# Patient Record
Sex: Female | Born: 1985 | Race: White | Hispanic: No | Marital: Married | Smoking: Never smoker
Health system: Southern US, Community
[De-identification: ages and names within clinical notes are randomized; demographics above are authoritative.]

## PROBLEM LIST (undated history)

## (undated) ENCOUNTER — Inpatient Hospital Stay (HOSPITAL_COMMUNITY): Payer: Self-pay

## (undated) DIAGNOSIS — D649 Anemia, unspecified: Secondary | ICD-10-CM

## (undated) DIAGNOSIS — O24419 Gestational diabetes mellitus in pregnancy, unspecified control: Secondary | ICD-10-CM

## (undated) DIAGNOSIS — G971 Other reaction to spinal and lumbar puncture: Secondary | ICD-10-CM

## (undated) DIAGNOSIS — R112 Nausea with vomiting, unspecified: Secondary | ICD-10-CM

## (undated) DIAGNOSIS — Z9889 Other specified postprocedural states: Secondary | ICD-10-CM

## (undated) HISTORY — PX: OTHER SURGICAL HISTORY: SHX169

## (undated) HISTORY — PX: CERVICAL CONE BIOPSY: SUR198

---

## 2011-11-15 ENCOUNTER — Encounter (HOSPITAL_COMMUNITY): Payer: Self-pay | Admitting: Emergency Medicine

## 2011-11-15 ENCOUNTER — Emergency Department (HOSPITAL_COMMUNITY)
Admission: EM | Admit: 2011-11-15 | Discharge: 2011-11-16 | Disposition: A | Discharge status: Home / Self Care | Payer: Medicaid Other | Attending: Emergency Medicine | Admitting: Emergency Medicine

## 2011-11-15 DIAGNOSIS — R51 Headache: Secondary | ICD-10-CM | POA: Insufficient documentation

## 2011-11-15 DIAGNOSIS — H53149 Visual discomfort, unspecified: Secondary | ICD-10-CM | POA: Insufficient documentation

## 2011-11-15 LAB — CBC
Hemoglobin: 13.6 g/dL (ref 12.0–15.0)
MCH: 29.8 pg (ref 26.0–34.0)
MCV: 85.3 fL (ref 78.0–100.0)
RBC: 4.57 MIL/uL (ref 3.87–5.11)
WBC: 10.5 10*3/uL (ref 4.0–10.5)

## 2011-11-15 LAB — DIFFERENTIAL
Eosinophils Absolute: 0.2 10*3/uL (ref 0.0–0.7)
Eosinophils Relative: 2 % (ref 0–5)
Lymphocytes Relative: 28 % (ref 12–46)
Lymphs Abs: 2.9 10*3/uL (ref 0.7–4.0)
Monocytes Relative: 6 % (ref 3–12)
Neutrophils Relative %: 65 % (ref 43–77)

## 2011-11-15 LAB — BASIC METABOLIC PANEL
CO2: 19 mEq/L (ref 19–32)
Glucose, Bld: 131 mg/dL — ABNORMAL HIGH (ref 70–99)
Potassium: 4 mEq/L (ref 3.5–5.1)
Sodium: 135 mEq/L (ref 135–145)

## 2011-11-15 MED ORDER — SODIUM CHLORIDE 0.9 % IV BOLUS (SEPSIS)
1000.0000 mL | Freq: Once | INTRAVENOUS | Status: AC
  Administered 2011-11-16: 1000 mL via INTRAVENOUS

## 2011-11-15 MED ORDER — DEXAMETHASONE SODIUM PHOSPHATE 10 MG/ML IJ SOLN
10.0000 mg | Freq: Once | INTRAMUSCULAR | Status: AC
  Administered 2011-11-16: 10 mg via INTRAVENOUS
  Filled 2011-11-15: qty 1

## 2011-11-15 MED ORDER — DIPHENHYDRAMINE HCL 50 MG/ML IJ SOLN
25.0000 mg | Freq: Once | INTRAMUSCULAR | Status: AC
  Administered 2011-11-16: 25 mg via INTRAVENOUS
  Filled 2011-11-15: qty 1

## 2011-11-15 MED ORDER — METOCLOPRAMIDE HCL 5 MG/ML IJ SOLN
10.0000 mg | Freq: Once | INTRAMUSCULAR | Status: AC
  Administered 2011-11-16: 10 mg via INTRAVENOUS
  Filled 2011-11-15: qty 2

## 2011-11-15 NOTE — ED Provider Notes (Signed)
History     CSN: [id number]  Arrival date & time 11/15/11  2155   First MD Initiated Contact with Patient 11/15/11 2312      Chief Complaint  Patient presents with  . 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  She reports continued headache, photophobia but denies fever, chills, nausea or vomiting - states that pain is worse with leaning forward and bending over to tie her shoes.  Patient is a 26 y.o. female presenting with headaches. The history is provided by the patient. No language interpreter was used.  Headache  This is a recurrent problem. The current episode started more than 1 week ago. The problem occurs constantly. The problem has not changed since onset.The headache is associated with bright light and coughing. The pain is located in the frontal region. The quality of the pain is described as throbbing. The pain is at a severity of 10/10. The pain is severe. The pain does not radiate. Pertinent negatives include no anorexia, no fever, no malaise/fatigue, no chest pressure, no near-syncope, no orthopnea, no palpitations, no syncope, no shortness of breath, no nausea and no vomiting. She has tried oral narcotic analgesics for the symptoms. The  treatment provided no relief.    History reviewed. No pertinent past medical history.  Past Surgical History  Procedure Date  . Cesarean section   . Cervical cone biopsy     No family history on file.  History  Substance Use Topics  . Smoking status: Never Smoker   . Smokeless tobacco: Not on file  . Alcohol Use: No    OB History    Grav Para Term Preterm Abortions TAB SAB Ect Mult Living                  Review of Systems  Constitutional: Negative for fever and malaise/fatigue.  HENT: Negative for neck pain, neck stiffness and sinus pressure.   Eyes: Positive for photophobia. Negative for pain and visual disturbance.  Respiratory: Negative for shortness of breath.   Cardiovascular: Negative for palpitations, orthopnea, syncope and near-syncope.  Gastrointestinal: Negative for nausea, vomiting, abdominal pain and anorexia.  Neurological: Positive for dizziness and headaches. Negative for seizures, syncope, speech difficulty, weakness and numbness.  All other systems reviewed and are negative.    Allergies  Tramadol  Home Medications  No current outpatient prescriptions on file.  BP 135/87  Temp(Src) 98.4 F (36.9 C) (Oral)  Resp 18  SpO2 99%  LMP 10/31/2011  Physical Exam  Nursing note and vitals reviewed. Constitutional: She is oriented to person, place, and time. She appears well-developed and well-nourished. No distress.  HENT:  Head: Normocephalic and atraumatic.  Right Ear: External ear normal.  Left Ear: External ear normal.  Nose: Nose normal.  Mouth/Throat: Oropharynx is clear and moist. No oropharyngeal exudate.  Eyes: Conjunctivae are normal. Pupils are equal, round, and reactive to light. No scleral icterus.  Neck: Normal range of motion. Neck supple.  Cardiovascular: Normal rate, regular rhythm and normal heart sounds.  Exam reveals no gallop and no friction rub.   No murmur heard. Pulmonary/Chest: Effort normal and breath sounds normal. No  respiratory distress. She exhibits no tenderness.  Abdominal: Soft. Bowel sounds are normal. She exhibits no distension and no mass. There is no tenderness. There is no rebound and no guarding.  Musculoskeletal: Normal range of motion. She exhibits no edema and no tenderness.  Lymphadenopathy:    She has no cervical adenopathy.  Neurological: She is alert and oriented to person, place, and time. No cranial nerve deficit. She exhibits normal muscle tone. Coordination normal.  Skin: Skin is warm and dry. No rash noted. No erythema. No pallor.  Psychiatric: She has a normal mood and affect. Her behavior is normal. Judgment and thought content normal.    ED Course  Procedures (including critical care time)  Labs Reviewed  BASIC METABOLIC PANEL - Abnormal; Notable for the following:    Glucose, Bld 131 (*)    All other components within normal limits  CBC  DIFFERENTIAL  CBC  DIFFERENTIAL   No results found. Results for orders placed during the hospital encounter of 11/15/11  BASIC METABOLIC PANEL      Component Value Range   Sodium 135  135 - 145 (mEq/L)   Potassium 4.0  3.5 - 5.1 (mEq/L)   Chloride 102  96 - 112 (mEq/L)   CO2 19  19 - 32 (mEq/L)   Glucose, Bld 131 (*) 70 - 99 (mg/dL)   BUN 9  6 - 23 (mg/dL)   Creatinine, Ser 4.09  0.50 - 1.10 (mg/dL)   Calcium 9.6  8.4 - 81.1 (mg/dL)   GFR calc non Af Amer >90  >90 (mL/min)   GFR calc Af Amer >90  >90 (mL/min)  CBC      Component Value Range   WBC 10.5  4.0 - 10.5 (K/uL)   RBC 4.57  3.87 - 5.11 (MIL/uL)   Hemoglobin 13.6  12.0 - 15.0 (g/dL)   HCT 91.4  78.2 - 95.6 (%)   MCV 85.3  78.0 - 100.0 (fL)   MCH 29.8  26.0 - 34.0 (pg)   MCHC 34.9  30.0 - 36.0 (g/dL)   RDW 21.3  08.6 - 57.8 (%)   Platelets 245  150 - 400 (K/uL)  DIFFERENTIAL      Component Value Range   Neutrophils Relative 65  43 - 77 (%)   Neutro Abs 6.8  1.7 - 7.7 (K/uL)   Lymphocytes Relative 28  12 - 46 (%)   Lymphs Abs 2.9  0.7 - 4.0 (K/uL)   Monocytes  Relative 6  3 - 12 (%)   Monocytes Absolute 0.6  0.1 - 1.0 (K/uL)   Eosinophils Relative 2  0 - 5 (%)   Eosinophils Absolute 0.2  0.0 - 0.7 (K/uL)   Basophils Relative 0  0 - 1 (%)   Basophils Absolute 0.0  0.0 - 0.1 (K/uL)  POCT PREGNANCY, URINE      Component Value Range   Preg Test, Ur NEGATIVE  NEGATIVE    No results found.    No diagnosis found.    MDM  Have obtained lab results from [facility] which notes that later the  CSF culture grew out 1 colony of gram positive cocci - they were thinking this was likely a contamination but they called her with this - all of the other tests were normal.  Plan to get pain under control and though I doubt the need, may admit the patient for repeat LP        Scarlette Calico C. [place], [place] 11/16/11 0126

## 2011-11-15 NOTE — ED Notes (Signed)
PT. REPORTS HEADACHE SINCE Monday LUMBAR TAP DONE AT [facility], RECEIVED A CALL FROM HOSPITAL TO GO TO ER FOR FURTHER EVALUATION DUE TO FINDINGS AT LP RESULTS.

## 2011-11-16 MED ORDER — SULFAMETHOXAZOLE-TRIMETHOPRIM 800-160 MG PO TABS: 1.0000 | ORAL_TABLET | Freq: Two times a day (BID) | ORAL | Status: AC

## 2011-11-16 MED ORDER — OXYCODONE-ACETAMINOPHEN 5-325 MG PO TABS: 2.0000 | ORAL_TABLET | ORAL | Status: AC | PRN

## 2011-11-16 MED ORDER — CEPHALEXIN 500 MG PO CAPS: 500.0000 mg | ORAL_CAPSULE | Freq: Four times a day (QID) | ORAL | Status: AC

## 2011-11-16 MED ORDER — DROPERIDOL 2.5 MG/ML IJ SOLN
1.2500 mg | Freq: Once | INTRAMUSCULAR | Status: AC
  Administered 2011-11-16: 1.25 mg via INTRAVENOUS
  Filled 2011-11-16: qty 0.5

## 2011-11-16 MED ORDER — VANCOMYCIN HCL IN DEXTROSE 1-5 GM/200ML-% IV SOLN
1000.0000 mg | Freq: Once | INTRAVENOUS | Status: AC
  Administered 2011-11-16: 1000 mg via INTRAVENOUS
  Filled 2011-11-16: qty 200

## 2011-11-16 MED ORDER — HYDROMORPHONE HCL PF 1 MG/ML IJ SOLN
1.0000 mg | Freq: Once | INTRAMUSCULAR | Status: AC
  Administered 2011-11-16: 1 mg via INTRAVENOUS
  Filled 2011-11-16: qty 1

## 2011-11-16 MED ORDER — OXYCODONE-ACETAMINOPHEN 5-325 MG PO TABS
2.0000 | ORAL_TABLET | Freq: Once | ORAL | Status: AC
  Administered 2011-11-16: 2 via ORAL
  Filled 2011-11-16: qty 2

## 2011-11-16 NOTE — ED Provider Notes (Signed)
Medical screening examination/treatment/procedure(s) were conducted as a shared visit with non-physician practitioner(s) and myself.  I personally evaluated the patient during the encounter  See my previous note  Glynn Octave, MD 11/16/11 [phone]

## 2011-11-16 NOTE — Discharge Instructions (Signed)
Your lumbar puncture results are likely contaminated. It is very unlikely that you have meningitis.  Take the antibiotics as a precaution.  Follow up with your doctor this week. Return to the ED if you develop new or worsening symptoms.  Headache, General, Unknown Cause The specific cause of your headache may not have been found today. There are many causes and types of headache. A few common ones are:  Tension headache.   Migraine.   Infections (examples: dental and sinus infections).   Bone and/or joint problems in the neck or jaw.   Depression.   Eye problems.  These headaches are not life threatening.  Headaches can sometimes be diagnosed by a patient history and a physical exam. Sometimes, lab and imaging studies (such as x-ray and/or CT scan) are used to rule out more serious problems. In some cases, a spinal tap (lumbar puncture) may be requested. There are many times when your exam and tests may be normal on the first visit even when there is a serious problem causing your headaches. Because of that, it is very important to follow up with your doctor or local clinic for further evaluation. FINDING OUT THE RESULTS OF TESTS  If a radiology test was performed, a radiologist will review your results.   You will be contacted by the emergency department or your physician if any test results require a change in your treatment plan.   Not all test results may be available during your visit. If your test results are not back during the visit, make an appointment with your caregiver to find out the results. Do not assume everything is normal if you have not heard from your caregiver or the medical facility. It is important for you to follow up on all of your test results.  HOME CARE INSTRUCTIONS   Keep follow-up appointments with your caregiver, or any specialist referral.   Only take over-the-counter or prescription medicines for pain, discomfort, or fever as directed by your caregiver.     Biofeedback, massage, or other relaxation techniques may be helpful.   Ice packs or heat applied to the head and neck can be used. Do this three to four times per day, or as needed.   Call your doctor if you have any questions or concerns.   If you smoke, you should quit.  SEEK MEDICAL CARE IF:   You develop problems with medications prescribed.   You do not respond to or obtain relief from medications.   You have a change from the usual headache.   You develop nausea or vomiting.  SEEK IMMEDIATE MEDICAL CARE IF:   If your headache becomes severe.   You have an unexplained oral temperature above 102 F (38.9 C), or as your caregiver suggests.   You have a stiff neck.   You have loss of vision.   You have muscular weakness.   You have loss of muscular control.   You develop severe symptoms different from your first symptoms.   You start losing your balance or have trouble walking.   You feel faint or pass out.  MAKE SURE YOU:   Understand these instructions.   Will watch your condition.   Will get help right away if you are not doing well or get worse.  Document Released: 08/12/2005 Document Revised: 08/01/2011 Document Reviewed: 03/31/2008 [facility] Patient Information 2012 [place], [place].

## 2011-11-16 NOTE — ED Provider Notes (Signed)
5 days of headache sent from outside hospital today with LP culture that had a rare gram-positive cocci.  Results obtained from [facility]. Patient had CSF with 1 white cell and 0 RBCs. Her Gram stain was negative. Patient was called today because culture showed rare growth of gram-positive cocci. States her headache is been the same for the past 5 days. She has a nonfocal neurological exam, no meningismus, no fever. She appears well.  Gram positive cocci likely contaminant.  Patient clinically does not have meningitis if she did have bacterial meningitis, she would appear much more ill.  We'll empirically cover her with a dose of vancomycin and treat with by mouth antibiotics.  Glynn Octave, MD 11/16/11 0300

## 2013-08-26 NOTE — L&D Delivery Note (Signed)
Cesarean Section Procedure Note  Indications: previous uterine incision kerr x2 and patient in early labor   Pre-operative Diagnosis: 38 week 2 day pregnancy, prior LTCS in early labor  Post-operative Diagnosis: same  Surgeon: Caffie Damme  Assistants: None  Anesthesia: Spinal anesthesia  ASA Class: 2  Procedure Details  The patient was seen in the Holding Room. The risks, benefits, complications, treatment options, and expected outcomes were discussed with the patient. The patient concurred with the proposed plan, giving informed consent. The site of surgery properly noted/marked. The patient was taken to Operating Room # 9, identified as Vicki Ortiz and the procedure verified as C-Section Delivery. A Time Out was held and the above information confirmed.  After induction of spinal anesthesia, the patient was placed in the dorsal supine position with a leftward tilt, draped and prepped in the usual sterile manner. A Pfannenstiel incision was made and carried down through the subcutaneous tissue to the fascia. The fascia was incised in the midline and the fascial incision was extended laterally with Mayo scissors. The superior aspect of the fascial incision was grasped with Coker clamps x2, tented up and the rectus muscles dissected off sharply with the scalpel. The rectus was then dissected off with blunt dissection and Mayo scissors inferiorly. The rectus muscles were separated in the midline. The abdominal peritoneum was identified, tented up, entered sharply, and the incision was extended superiorly and inferiorly with good visualization of the bladder. The Alexis retractor was then deployed. The vesicouterine peritoneum was identified, tented up, entered sharply with Metzenbaum scissors, and the bladder flap was created digitally. Scalpel was then used to make a low transverse incision on the uterus which was extended laterally with blunt dissection. The fetal vertex was identified,  difficulty delivering head, therefore Vacuum applied to fetal vertex and then the head was delivered easily through the uterine incision followed by the body. The A live female infant was bulb suctioned on the operative field cried vigorously, cord was clamped and cut and the infant was passed to the waiting neonatologist. Apgars 8/9. Placenta was then delivered spontaneously, intact and appear normal, the uterus was cleared of all clot and debris. The uterine incision was repaired with #0 Monocryl in running locked fashion. A second imbricating suture of 0 Monocryl was utilized to ensure hemostasis. Ovaries and tubes were inspected and normal. The peritoneal cavity was irrigated with warm normal saline. The Alexis retractor was removed. The abdominal peritoneum was reapproximated with 2-0 Chromic in a running fashion, the rectus muscles was reapproximated one interrupted suture of 2-0 chromic. in interrupted fashion. The fascia was closed with 0 looped PDS in a running fashion from both ends of fascia, Met in the middle. The subcutaneous layer was reapproximated with 2-0 plain gut. The skin was closed with 4-0 vicryl in a subcuticular fashion and dressed with benzoine, steri strips and pressure dressing. All sponge lap and needle counts were correct x2. Patient tolerated the procedure well and recovered in stable condition following the procedure.  Instrument, sponge, and needle counts were correct prior the abdominal closure and at the conclusion of the case.  Findings:  Live female infant,8lbs 4.6oz  Apgars 8/9, clear fluid, normal appearing placenta, normal uterus, bilateral tubes and ovaries  Estimated Blood Loss: 893m  Drains: Foley catheter  Specimens: Placenta  Implants: none  Complications: None; patient tolerated the procedure well.  Disposition: PACU - hemodynamically stable.  WRogelio SeenPinn

## 2013-10-10 ENCOUNTER — Encounter (HOSPITAL_COMMUNITY): Payer: Self-pay | Admitting: Emergency Medicine

## 2013-10-10 ENCOUNTER — Emergency Department (HOSPITAL_COMMUNITY)
Admission: EM | Admit: 2013-10-10 | Discharge: 2013-10-10 | Disposition: A | Discharge status: Home / Self Care | Payer: Medicaid Other | Attending: Emergency Medicine | Admitting: Emergency Medicine

## 2013-10-10 DIAGNOSIS — O9989 Other specified diseases and conditions complicating pregnancy, childbirth and the puerperium: Secondary | ICD-10-CM | POA: Insufficient documentation

## 2013-10-10 DIAGNOSIS — L03319 Cellulitis of trunk, unspecified: Secondary | ICD-10-CM

## 2013-10-10 DIAGNOSIS — Z3490 Encounter for supervision of normal pregnancy, unspecified, unspecified trimester: Secondary | ICD-10-CM

## 2013-10-10 DIAGNOSIS — L02211 Cutaneous abscess of abdominal wall: Secondary | ICD-10-CM

## 2013-10-10 DIAGNOSIS — L02219 Cutaneous abscess of trunk, unspecified: Secondary | ICD-10-CM | POA: Insufficient documentation

## 2013-10-10 MED ORDER — ACETAMINOPHEN 325 MG PO TABS
650.0000 mg | ORAL_TABLET | Freq: Once | ORAL | Status: AC
  Administered 2013-10-10: 650 mg via ORAL
  Filled 2013-10-10: qty 2

## 2013-10-10 MED ORDER — CLINDAMYCIN HCL 150 MG PO CAPS: 300.0000 mg | ORAL_CAPSULE | Freq: Three times a day (TID) | ORAL | Status: DC

## 2013-10-10 MED ORDER — CLINDAMYCIN HCL 300 MG PO CAPS
300.0000 mg | ORAL_CAPSULE | Freq: Once | ORAL | Status: AC
  Administered 2013-10-10: 300 mg via ORAL
  Filled 2013-10-10: qty 1

## 2013-10-10 NOTE — ED Notes (Signed)
The pt is 25 weeks preg and she has a blistered area to her umbilicus.  The area below that is red swollen and painful.  Her edc is may 30.  Normal preg.

## 2013-10-10 NOTE — ED Notes (Signed)
Pt reports redness and tender area to lower umbilicus for three days. Small hardened area noted, skin red and warm to touch. Pt observed touching area, informed not to touch area.

## 2013-10-10 NOTE — ED Notes (Signed)
I/D tray placed at bedside.  

## 2013-10-10 NOTE — ED Provider Notes (Signed)
CSN: [id number]     Arrival date & time 10/10/13  0055 History   First MD Initiated Contact with Patient 10/10/13 0110     Chief Complaint  Patient presents with  . Cellulitis     (Consider location/radiation/quality/duration/timing/severity/associated sxs/prior Treatment) The history is provided by the patient.   28 year old female who is [redacted] weeks pregnant has noted redness and pain in the area inferior to her umbilicus has been present for the last 3 days and getting worse. Pain is moderate and she rates it at 6/10. It is worse with palpation. Nothing makes it better. She denies fever or chills. The area is slightly swollen and overlies scar from previous laparoscopy.  History reviewed. No pertinent past medical history. Past Surgical History  Procedure Laterality Date  . Cesarean section    . Cervical cone biopsy     No family history on file. History  Substance Use Topics  . Smoking status: Never Smoker   . Smokeless tobacco: Not on file  . Alcohol Use: No   OB History   Grav Para Term Preterm Abortions TAB SAB Ect Mult Living   1              Review of Systems  All other systems reviewed and are negative.      Allergies  Tramadol  Home Medications  No current outpatient prescriptions on file. BP 131/74  Pulse 84  Temp(Src) 97.4 F (36.3 C) (Oral)  Resp 16  Ht 6' (1.829 m)  Wt 260 lb (117.935 kg)  BMI 35.25 kg/m2  SpO2 98% Physical Exam  Nursing note and vitals reviewed.  28 year old female, resting comfortably and in no acute distress. Vital signs are normal. Oxygen saturation is 98%, which is normal. Head is normocephalic and atraumatic. PERRLA, EOMI. Oropharynx is clear. Neck is nontender and supple without adenopathy or JVD. Back is nontender and there is no CVA tenderness. Lungs are clear without rales, wheezes, or rhonchi. Chest is nontender. Heart has regular rate and rhythm without murmur. Abdomen has a gravid uterus with size consistent with  dates. Periumbilical scar from previous laparoscopy is present with mild keloid formation. Inferior to this, has an area of erythema with a small area of fluctuance. Appearance is suspicious for abscess with cellulitis. Extremities have no cyanosis or edema, full range of motion is present. Skin is warm and dry without rash. Neurologic: Mental status is normal, cranial nerves are intact, there are no motor or sensory deficits.  ED Course  Procedures (including critical care time) Limited bedside ultrasound was done to evaluate possible abscess. Areas of fluid collection in the subcutaneous tissue are noted consistent with abscess. Images were saved.  INCISION AND DRAINAGE Performed by: YQMVH,QIONGGLICK,Sehaj Kolden Consent: Verbal consent obtained. Risks and benefits: risks, benefits and alternatives were discussed Type: abscess  Body area: Abdominal wall  Anesthesia: local infiltration  Incision was made with a scalpel.  Local anesthetic: lidocaine 2% without epinephrine  Anesthetic total: 3 ml  Complexity: complex Blunt dissection to break up loculations  Drainage: purulent  Drainage amount: Moderate   Packing material: None   Patient tolerance: Patient tolerated the procedure well with no immediate complications.    MDM   Final diagnoses:  Abscess of abdominal wall  Pregnancy    Abscess versus cellulitis of the abdominal wall. Second trimester pregnancy. This after incision and drainage, she is discharged with prescription for clindamycin.    Dione Boozeavid Calle Schader, MD 10/10/13 [phone]

## 2013-10-10 NOTE — Discharge Instructions (Signed)
Abscess An abscess is an infected area that contains a collection of pus and debris.It can occur in almost any part of the body. An abscess is also known as a furuncle or boil. CAUSES  An abscess occurs when tissue gets infected. This can occur from blockage of oil or sweat glands, infection of hair follicles, or a minor injury to the skin. As the body tries to fight the infection, pus collects in the area and creates pressure under the skin. This pressure causes pain. People with weakened immune systems have difficulty fighting infections and get certain abscesses more often.  SYMPTOMS Usually an abscess develops on the skin and becomes a painful mass that is red, warm, and tender. If the abscess forms under the skin, you may feel a moveable soft area under the skin. Some abscesses break open (rupture) on their own, but most will continue to get worse without care. The infection can spread deeper into the body and eventually into the bloodstream, causing you to feel ill.  DIAGNOSIS  Your caregiver will take your medical history and perform a physical exam. A sample of fluid may also be taken from the abscess to determine what is causing your infection. TREATMENT  Your caregiver may prescribe antibiotic medicines to fight the infection. However, taking antibiotics alone usually does not cure an abscess. Your caregiver may need to make a small cut (incision) in the abscess to drain the pus. In some cases, gauze is packed into the abscess to reduce pain and to continue draining the area. HOME CARE INSTRUCTIONS   Only take over-the-counter or prescription medicines for pain, discomfort, or fever as directed by your caregiver.  If you were prescribed antibiotics, take them as directed. Finish them even if you start to feel better.  If gauze is used, follow your caregiver's directions for changing the gauze.  To avoid spreading the infection:  Keep your draining abscess covered with a  bandage.  Wash your hands well.  Do not share personal care items, towels, or whirlpools with others.  Avoid skin contact with others.  Keep your skin and clothes clean around the abscess.  Keep all follow-up appointments as directed by your caregiver. SEEK MEDICAL CARE IF:   You have increased pain, swelling, redness, fluid drainage, or bleeding.  You have muscle aches, chills, or a general ill feeling.  You have a fever. MAKE SURE YOU:   Understand these instructions.  Will watch your condition.  Will get help right away if you are not doing well or get worse. Document Released: 05/22/2005 Document Revised: 02/11/2012 Document Reviewed: 10/25/2011 [facility] Patient Information 2014 [place], [place].  Clindamycin capsules What is this medicine? CLINDAMYCIN (KLIN da MYE sin) is a lincosamide antibiotic. It is used to treat certain kinds of bacterial infections. It will not work for colds, flu, or other viral infections. This medicine may be used for other purposes; ask your health care provider or pharmacist if you have questions. COMMON BRAND NAME(S): Cleocin What should I tell my health care provider before I take this medicine? They need to know if you have any of these conditions: -kidney disease -liver disease -stomach problems like colitis -an unusual or allergic reaction to clindamycin, lincomycin, or other medicines, foods, dyes like tartrazine or preservatives -pregnant or trying to get pregnant -breast-feeding How should I use this medicine? Take this medicine by mouth with a full glass of water. Follow the directions on the prescription label. You can take this medicine with food or on  an empty stomach. If the medicine upsets your stomach, take it with food. Take your medicine at regular intervals. Do not take your medicine more often than directed. Take all of your medicine as directed even if you think your are better. Do not skip doses or stop your medicine  early. Talk to your pediatrician regarding the use of this medicine in children. Special care may be needed. Overdosage: If you think you have taken too much of this medicine contact a poison control center or emergency room at once. NOTE: This medicine is only for you. Do not share this medicine with others. What if I miss a dose? If you miss a dose, take it as soon as you can. If it is almost time for your next dose, take only that dose. Do not take double or extra doses. What may interact with this medicine? -chloramphenicol -erythromycin -kaolin products This list may not describe all possible interactions. Give your health care provider a list of all the medicines, herbs, non-prescription drugs, or dietary supplements you use. Also tell them if you smoke, drink alcohol, or use illegal drugs. Some items may interact with your medicine. What should I watch for while using this medicine? Tell your doctor or healthcare professional if your symptoms do not start to get better or if they get worse. Do not treat diarrhea with over the counter products. Contact your doctor if you have diarrhea that lasts more than 2 days or if it is severe and watery. What side effects may I notice from receiving this medicine? Side effects that you should report to your doctor or health care professional as soon as possible: -allergic reactions like skin rash, itching or hives, swelling of the face, lips, or tongue -dark urine -pain on swallowing -redness, blistering, peeling or loosening of the skin, including inside the mouth -unusual bleeding or bruising -unusually weak or tired -yellowing of eyes or skin Side effects that usually do not require medical attention (report to your doctor or health care professional if they continue or are bothersome): -diarrhea -itching in the rectal or genital area -joint pain -nausea, vomiting -stomach pain This list may not describe all possible side effects. Call your  doctor for medical advice about side effects. You may report side effects to FDA at 1-800-FDA-1088. Where should I keep my medicine? Keep out of the reach of children. Store at room temperature between 20 and 25 degrees C (68 and 77 degrees F). Throw away any unused medicine after the expiration date. NOTE: This sheet is a summary. It may not cover all possible information. If you have questions about this medicine, talk to your doctor, pharmacist, or health care provider.  2014, [employer]vier/Gold Standard. (2010-01-03 10:12:31)

## 2013-11-06 ENCOUNTER — Encounter (HOSPITAL_COMMUNITY): Payer: Self-pay

## 2013-11-06 ENCOUNTER — Inpatient Hospital Stay (HOSPITAL_COMMUNITY)
Admission: AD | Admit: 2013-11-06 | Discharge: 2013-11-06 | Disposition: A | Discharge status: Home / Self Care | Payer: Medicaid Other | Source: Ambulatory Visit | Attending: Obstetrics and Gynecology | Admitting: Obstetrics and Gynecology

## 2013-11-06 DIAGNOSIS — S3140XA Unspecified open wound of vagina and vulva, initial encounter: Secondary | ICD-10-CM | POA: Insufficient documentation

## 2013-11-06 DIAGNOSIS — O99891 Other specified diseases and conditions complicating pregnancy: Secondary | ICD-10-CM | POA: Insufficient documentation

## 2013-11-06 DIAGNOSIS — O4690 Antepartum hemorrhage, unspecified, unspecified trimester: Secondary | ICD-10-CM | POA: Insufficient documentation

## 2013-11-06 DIAGNOSIS — O9989 Other specified diseases and conditions complicating pregnancy, childbirth and the puerperium: Principal | ICD-10-CM

## 2013-11-06 DIAGNOSIS — Y92009 Unspecified place in unspecified non-institutional (private) residence as the place of occurrence of the external cause: Secondary | ICD-10-CM | POA: Insufficient documentation

## 2013-11-06 DIAGNOSIS — S31512A Laceration without foreign body of unspecified external genital organs, female, initial encounter: Secondary | ICD-10-CM

## 2013-11-06 DIAGNOSIS — X58XXXA Exposure to other specified factors, initial encounter: Secondary | ICD-10-CM | POA: Insufficient documentation

## 2013-11-06 HISTORY — DX: Other reaction to spinal and lumbar puncture: G97.1

## 2013-11-06 HISTORY — DX: Anemia, unspecified: D64.9

## 2013-11-06 HISTORY — DX: Nausea with vomiting, unspecified: R11.2

## 2013-11-06 HISTORY — DX: Gestational diabetes mellitus in pregnancy, unspecified control: O24.419

## 2013-11-06 HISTORY — DX: Other specified postprocedural states: Z98.890

## 2013-11-06 NOTE — Discharge Instructions (Signed)

## 2013-11-06 NOTE — MAU Note (Signed)
Vag bleeding at 0245 when she was having intercourse, Large amt on sheet and dark red blood covered her hand. Patient changed into gown and is no longer bleeding here.

## 2013-11-06 NOTE — MAU Provider Note (Signed)
Chief Complaint:  Vaginal Bleeding   First Provider Initiated Contact with Patient 11/06/13 0416      HPI: Vicki Ortiz is a 28 y.o. G4P3003 at [redacted]w[redacted]d who presents to maternity admissions reporting bright red vaginal bleeding occuring during intercourse.  She reports that she felt like fluid was coming out during intercourse so she turned on the lights to find a large spot of blood on the bed. She reached down and the blood soaked her hand.  She continued to see blood when wiping in the bathroom.  She reports the bleeding has slowed down and is not wearing a pad upon arrival to the MAU. She reports good fetal movement, denies abdominal pain/cramping/contractions, LOF, vaginal itching/burning, urinary symptoms, h/a, dizziness, n/v, or fever/chills.     Past Medical History: Past Medical History  Diagnosis Date  . Spinal headache   . PONV (postoperative nausea and vomiting)   . Anemia   . Gestational diabetes     Past obstetric history: OB History  Gravida Para Term Preterm AB SAB TAB Ectopic Multiple Living  4 3 3       3     # Outcome Date GA Lbr Len/2nd Weight Sex Delivery Anes PTL Lv  4 CUR           3 TRM 04/22/08    Octaviano Glow   Y  2 TRM 08/29/06    Octaviano Glow   Y  1 TRM 07/14/03    F SVD   Y      Past Surgical History: Past Surgical History  Procedure Laterality Date  . Cesarean section    . Cervical cone biopsy    . Laproscopy for endometriois      Family History: Family History  Problem Relation Age of Onset  . Diabetes Mother   . COPD Father   . Diabetes Father     Social History: History  Substance Use Topics  . Smoking status: Never Smoker   . Smokeless tobacco: Never Used  . Alcohol Use: No    Allergies:  Allergies  Allergen Reactions  . Tramadol Nausea And Vomiting and Other (See Comments)    Weakness: Dizziness    Meds:  Prescriptions prior to admission  Medication Sig Dispense Refill  . clindamycin (CLEOCIN) 150 MG capsule Take 2 capsules (300 mg  total) by mouth 3 (three) times daily.  21 capsule  0    ROS: Pertinent findings in history of present illness.  Physical Exam  Blood pressure 110/68, pulse 95, temperature 98.5 F (36.9 C), temperature source Oral, resp. rate 20, height 6' (1.829 m), weight 259 lb 2 oz (117.538 kg). GENERAL: Well-developed, well-nourished female in no acute distress.  HEENT: normocephalic HEART: normal rate RESP: normal effort ABDOMEN: Soft, non-tender, gravid appropriate for gestational age EXTREMITIES: Nontender, no edema NEURO: alert and oriented Pelvic exam: Cervix pink, visually closed, without lesion, scant white creamy discharge, no blood noted vaginal walls normal, 1.5 cm laceration noted on left labia minora, initially not bleeding but with some blood seeping after palpation of bright red blood    FHT:  Baseline 135, moderate variability, accelerations present, no decelerations Contractions: None on toco or to palpation  Assessment: 1. Laceration of female external genital organ without foreign body     Plan: Consult Dr Henderson Cloud Discharge home No intercourse until laceration heals F/U in office  Return to MAU as needed       Follow-up Information   Follow up with HORVATH,MICHELLE A, MD. (Keep scheduled  appointments.  Call office or return to MAU if bleeding persists.  )    Specialty:  Obstetrics and Gynecology   Contact information:   [address] [place] [address] [phone]        Medication List         clindamycin 150 MG capsule  Commonly known as:  CLEOCIN  Take 2 capsules (300 mg total) by mouth 3 (three) times daily.        Sharen CounterLisa Leftwich-Kirby Certified Nurse-Midwife 11/06/2013 4:54 AM

## 2014-01-10 ENCOUNTER — Encounter (HOSPITAL_COMMUNITY)
Admission: AD | Disposition: A | Discharge status: Home / Self Care | Payer: Self-pay | Source: Ambulatory Visit | Attending: Obstetrics & Gynecology

## 2014-01-10 ENCOUNTER — Encounter (HOSPITAL_COMMUNITY): Payer: Self-pay | Admitting: *Deleted

## 2014-01-10 ENCOUNTER — Inpatient Hospital Stay (HOSPITAL_COMMUNITY)
Admission: AD | Admit: 2014-01-10 | Discharge: 2014-01-13 | DRG: 765 | Disposition: A | Discharge status: Home / Self Care | Payer: Medicaid Other | Source: Ambulatory Visit | Attending: Obstetrics & Gynecology | Admitting: Obstetrics & Gynecology

## 2014-01-10 ENCOUNTER — Encounter (HOSPITAL_COMMUNITY): Payer: Self-pay | Admitting: Anesthesiology

## 2014-01-10 ENCOUNTER — Encounter (HOSPITAL_COMMUNITY): Payer: Medicaid Other | Admitting: Anesthesiology

## 2014-01-10 ENCOUNTER — Inpatient Hospital Stay (HOSPITAL_COMMUNITY): Payer: Medicaid Other | Admitting: Anesthesiology

## 2014-01-10 DIAGNOSIS — Z6838 Body mass index (BMI) 38.0-38.9, adult: Secondary | ICD-10-CM

## 2014-01-10 DIAGNOSIS — O9852 Other viral diseases complicating childbirth: Secondary | ICD-10-CM

## 2014-01-10 DIAGNOSIS — O99814 Abnormal glucose complicating childbirth: Secondary | ICD-10-CM | POA: Diagnosis present

## 2014-01-10 DIAGNOSIS — E669 Obesity, unspecified: Secondary | ICD-10-CM | POA: Diagnosis present

## 2014-01-10 DIAGNOSIS — O34219 Maternal care for unspecified type scar from previous cesarean delivery: Principal | ICD-10-CM | POA: Diagnosis present

## 2014-01-10 DIAGNOSIS — O99214 Obesity complicating childbirth: Secondary | ICD-10-CM

## 2014-01-10 DIAGNOSIS — D649 Anemia, unspecified: Secondary | ICD-10-CM | POA: Diagnosis present

## 2014-01-10 DIAGNOSIS — Z8632 Personal history of gestational diabetes: Secondary | ICD-10-CM

## 2014-01-10 DIAGNOSIS — O9902 Anemia complicating childbirth: Secondary | ICD-10-CM | POA: Diagnosis present

## 2014-01-10 DIAGNOSIS — B069 Rubella without complication: Secondary | ICD-10-CM | POA: Diagnosis present

## 2014-01-10 DIAGNOSIS — Z833 Family history of diabetes mellitus: Secondary | ICD-10-CM

## 2014-01-10 LAB — ABO/RH: ABO/RH(D): A POS

## 2014-01-10 LAB — CBC
HEMATOCRIT: 34 % — AB (ref 36.0–46.0)
Hemoglobin: 10.8 g/dL — ABNORMAL LOW (ref 12.0–15.0)
MCH: 25.2 pg — ABNORMAL LOW (ref 26.0–34.0)
MCHC: 31.8 g/dL (ref 30.0–36.0)
MCV: 79.4 fL (ref 78.0–100.0)
Platelets: 192 10*3/uL (ref 150–400)
RBC: 4.28 MIL/uL (ref 3.87–5.11)
RDW: 14.7 % (ref 11.5–15.5)
WBC: 11.6 10*3/uL — ABNORMAL HIGH (ref 4.0–10.5)

## 2014-01-10 LAB — TYPE AND SCREEN
ABO/RH(D): A POS
Antibody Screen: NEGATIVE

## 2014-01-10 SURGERY — Surgical Case
Anesthesia: Spinal

## 2014-01-10 SURGERY — Surgical Case
Anesthesia: Regional

## 2014-01-10 MED ORDER — MENTHOL 3 MG MT LOZG: 1.0000 | LOZENGE | OROMUCOSAL | Status: DC | PRN

## 2014-01-10 MED ORDER — SODIUM CHLORIDE 0.9 % IJ SOLN: 3.0000 mL | INTRAMUSCULAR | Status: DC | PRN

## 2014-01-10 MED ORDER — FENTANYL CITRATE 0.05 MG/ML IJ SOLN
50.0000 ug | Freq: Once | INTRAMUSCULAR | Status: AC
  Administered 2014-01-10: 50 ug via INTRAVENOUS

## 2014-01-10 MED ORDER — CEFAZOLIN SODIUM-DEXTROSE 2-3 GM-% IV SOLR: 2.0000 g | INTRAVENOUS | Status: DC

## 2014-01-10 MED ORDER — OXYTOCIN 10 UNIT/ML IJ SOLN
INTRAMUSCULAR | Status: AC
Start: 2014-01-10 — End: 2014-01-10
  Filled 2014-01-10: qty 4

## 2014-01-10 MED ORDER — SENNOSIDES-DOCUSATE SODIUM 8.6-50 MG PO TABS
2.0000 | ORAL_TABLET | ORAL | Status: DC
  Administered 2014-01-11 – 2014-01-13 (×2): 2 via ORAL
  Filled 2014-01-10 (×2): qty 2

## 2014-01-10 MED ORDER — IBUPROFEN 600 MG PO TABS
600.0000 mg | ORAL_TABLET | Freq: Four times a day (QID) | ORAL | Status: DC
  Administered 2014-01-11 – 2014-01-13 (×9): 600 mg via ORAL
  Filled 2014-01-10 (×11): qty 1

## 2014-01-10 MED ORDER — MORPHINE SULFATE (PF) 0.5 MG/ML IJ SOLN
INTRAMUSCULAR | Status: DC | PRN
  Administered 2014-01-10: 4.9 mg via INTRAVENOUS

## 2014-01-10 MED ORDER — DIPHENHYDRAMINE HCL 50 MG/ML IJ SOLN
12.5000 mg | INTRAMUSCULAR | Status: DC | PRN
  Administered 2014-01-11: 12.5 mg via INTRAVENOUS
  Filled 2014-01-10: qty 1

## 2014-01-10 MED ORDER — LACTATED RINGERS IV SOLN
INTRAVENOUS | Status: DC | PRN
  Administered 2014-01-10: 19:00:00 via INTRAVENOUS

## 2014-01-10 MED ORDER — PHENYLEPHRINE 8 MG IN D5W 100 ML (0.08MG/ML) PREMIX OPTIME
INJECTION | INTRAVENOUS | Status: DC | PRN
  Administered 2014-01-10: 60 ug/min via INTRAVENOUS

## 2014-01-10 MED ORDER — MORPHINE SULFATE (PF) 0.5 MG/ML IJ SOLN
INTRAMUSCULAR | Status: DC | PRN
  Administered 2014-01-10: .1 mg via INTRATHECAL

## 2014-01-10 MED ORDER — KETOROLAC TROMETHAMINE 60 MG/2ML IM SOLN
INTRAMUSCULAR | Status: AC
  Administered 2014-01-10: 60 mg via INTRAMUSCULAR
  Filled 2014-01-10: qty 2

## 2014-01-10 MED ORDER — METOCLOPRAMIDE HCL 5 MG/ML IJ SOLN: 10.0000 mg | Freq: Once | INTRAMUSCULAR | Status: DC | PRN

## 2014-01-10 MED ORDER — MORPHINE SULFATE 0.5 MG/ML IJ SOLN
INTRAMUSCULAR | Status: AC
  Filled 2014-01-10: qty 10

## 2014-01-10 MED ORDER — LANOLIN HYDROUS EX OINT: 1.0000 "application " | TOPICAL_OINTMENT | CUTANEOUS | Status: DC | PRN

## 2014-01-10 MED ORDER — MEPERIDINE HCL 25 MG/ML IJ SOLN: 6.2500 mg | INTRAMUSCULAR | Status: DC | PRN

## 2014-01-10 MED ORDER — ONDANSETRON HCL 4 MG PO TABS
4.0000 mg | ORAL_TABLET | ORAL | Status: DC | PRN
  Administered 2014-01-13: 4 mg via ORAL
  Filled 2014-01-10: qty 1

## 2014-01-10 MED ORDER — KETOROLAC TROMETHAMINE 30 MG/ML IJ SOLN: 30.0000 mg | Freq: Four times a day (QID) | INTRAMUSCULAR | Status: AC | PRN

## 2014-01-10 MED ORDER — NALOXONE HCL 0.4 MG/ML IJ SOLN: 0.4000 mg | INTRAMUSCULAR | Status: DC | PRN

## 2014-01-10 MED ORDER — SIMETHICONE 80 MG PO CHEW: 80.0000 mg | CHEWABLE_TABLET | ORAL | Status: DC | PRN

## 2014-01-10 MED ORDER — ONDANSETRON HCL 4 MG/2ML IJ SOLN
INTRAMUSCULAR | Status: DC | PRN
  Administered 2014-01-10: 4 mg via INTRAVENOUS

## 2014-01-10 MED ORDER — PHENYLEPHRINE 8 MG IN D5W 100 ML (0.08MG/ML) PREMIX OPTIME
INJECTION | INTRAVENOUS | Status: AC
  Filled 2014-01-10: qty 100

## 2014-01-10 MED ORDER — METOCLOPRAMIDE HCL 5 MG/ML IJ SOLN: 10.0000 mg | Freq: Three times a day (TID) | INTRAMUSCULAR | Status: DC | PRN

## 2014-01-10 MED ORDER — DEXTROSE 5 % IV SOLN
3.0000 g | INTRAVENOUS | Status: AC
  Administered 2014-01-10: 3 g via INTRAVENOUS
  Filled 2014-01-10: qty 3000

## 2014-01-10 MED ORDER — SCOPOLAMINE 1 MG/3DAYS TD PT72
1.0000 | MEDICATED_PATCH | Freq: Once | TRANSDERMAL | Status: DC
  Administered 2014-01-10: 1.5 mg via TRANSDERMAL

## 2014-01-10 MED ORDER — OXYCODONE-ACETAMINOPHEN 5-325 MG PO TABS
1.0000 | ORAL_TABLET | ORAL | Status: DC | PRN
  Administered 2014-01-11 (×2): 1 via ORAL
  Administered 2014-01-11: 2 via ORAL
  Administered 2014-01-11: 1 via ORAL
  Administered 2014-01-12 – 2014-01-13 (×6): 2 via ORAL
  Filled 2014-01-10: qty 2
  Filled 2014-01-10: qty 1
  Filled 2014-01-10 (×7): qty 2
  Filled 2014-01-10 (×2): qty 1

## 2014-01-10 MED ORDER — LACTATED RINGERS IV SOLN
INTRAVENOUS | Status: DC
  Administered 2014-01-11: 04:00:00 via INTRAVENOUS

## 2014-01-10 MED ORDER — SCOPOLAMINE 1 MG/3DAYS TD PT72
MEDICATED_PATCH | TRANSDERMAL | Status: AC
  Administered 2014-01-10: 1.5 mg via TRANSDERMAL
  Filled 2014-01-10: qty 1

## 2014-01-10 MED ORDER — FENTANYL CITRATE 0.05 MG/ML IJ SOLN
INTRAMUSCULAR | Status: AC
  Filled 2014-01-10: qty 2

## 2014-01-10 MED ORDER — NALOXONE HCL 1 MG/ML IJ SOLN: 1.0000 ug/kg/h | INTRAVENOUS | Status: DC | PRN

## 2014-01-10 MED ORDER — KETOROLAC TROMETHAMINE 60 MG/2ML IM SOLN
60.0000 mg | Freq: Once | INTRAMUSCULAR | Status: AC | PRN
  Administered 2014-01-10: 60 mg via INTRAMUSCULAR

## 2014-01-10 MED ORDER — CITRIC ACID-SODIUM CITRATE 334-500 MG/5ML PO SOLN
30.0000 mL | Freq: Once | ORAL | Status: AC
  Administered 2014-01-10: 30 mL via ORAL
  Filled 2014-01-10: qty 15

## 2014-01-10 MED ORDER — ONDANSETRON HCL 4 MG/2ML IJ SOLN: 4.0000 mg | Freq: Three times a day (TID) | INTRAMUSCULAR | Status: DC | PRN

## 2014-01-10 MED ORDER — ONDANSETRON HCL 4 MG/2ML IJ SOLN
INTRAMUSCULAR | Status: AC
  Filled 2014-01-10: qty 2

## 2014-01-10 MED ORDER — OXYTOCIN 40 UNITS IN LACTATED RINGERS INFUSION - SIMPLE MED: 62.5000 mL/h | INTRAVENOUS | Status: AC

## 2014-01-10 MED ORDER — BUPIVACAINE IN DEXTROSE 0.75-8.25 % IT SOLN
INTRATHECAL | Status: DC | PRN
  Administered 2014-01-10: 12 mg via INTRATHECAL

## 2014-01-10 MED ORDER — DIPHENHYDRAMINE HCL 25 MG PO CAPS: 25.0000 mg | ORAL_CAPSULE | Freq: Four times a day (QID) | ORAL | Status: DC | PRN

## 2014-01-10 MED ORDER — SIMETHICONE 80 MG PO CHEW
80.0000 mg | CHEWABLE_TABLET | Freq: Three times a day (TID) | ORAL | Status: DC
  Administered 2014-01-11 – 2014-01-12 (×5): 80 mg via ORAL
  Filled 2014-01-10 (×5): qty 1

## 2014-01-10 MED ORDER — PRENATAL MULTIVITAMIN CH
1.0000 | ORAL_TABLET | Freq: Every day | ORAL | Status: DC
  Administered 2014-01-11 – 2014-01-13 (×3): 1 via ORAL
  Filled 2014-01-10 (×3): qty 1

## 2014-01-10 MED ORDER — FENTANYL CITRATE 0.05 MG/ML IJ SOLN
25.0000 ug | INTRAMUSCULAR | Status: DC | PRN
  Administered 2014-01-10 (×3): 50 ug via INTRAVENOUS

## 2014-01-10 MED ORDER — ONDANSETRON HCL 4 MG/2ML IJ SOLN
4.0000 mg | INTRAMUSCULAR | Status: DC | PRN
  Administered 2014-01-10: 4 mg via INTRAVENOUS
  Filled 2014-01-10: qty 2

## 2014-01-10 MED ORDER — DIPHENHYDRAMINE HCL 25 MG PO CAPS: 25.0000 mg | ORAL_CAPSULE | ORAL | Status: DC | PRN

## 2014-01-10 MED ORDER — TETANUS-DIPHTH-ACELL PERTUSSIS 5-2.5-18.5 LF-MCG/0.5 IM SUSP
0.5000 mL | Freq: Once | INTRAMUSCULAR | Status: AC
  Administered 2014-01-11: 0.5 mL via INTRAMUSCULAR
  Filled 2014-01-10: qty 0.5

## 2014-01-10 MED ORDER — FENTANYL CITRATE 0.05 MG/ML IJ SOLN
50.0000 ug | Freq: Once | INTRAMUSCULAR | Status: AC
  Administered 2014-01-11: 50 ug via INTRAVENOUS
  Filled 2014-01-10 (×3): qty 2

## 2014-01-10 MED ORDER — LACTATED RINGERS IV SOLN
INTRAVENOUS | Status: DC | PRN
  Administered 2014-01-10 (×3): via INTRAVENOUS

## 2014-01-10 MED ORDER — DIPHENHYDRAMINE HCL 50 MG/ML IJ SOLN: 25.0000 mg | INTRAMUSCULAR | Status: DC | PRN

## 2014-01-10 MED ORDER — FENTANYL CITRATE 0.05 MG/ML IJ SOLN
INTRAMUSCULAR | Status: AC
Start: 2014-01-10 — End: 2014-01-10
  Filled 2014-01-10: qty 2

## 2014-01-10 MED ORDER — DIBUCAINE 1 % RE OINT: 1.0000 "application " | TOPICAL_OINTMENT | RECTAL | Status: DC | PRN

## 2014-01-10 MED ORDER — WITCH HAZEL-GLYCERIN EX PADS: 1.0000 "application " | MEDICATED_PAD | CUTANEOUS | Status: DC | PRN

## 2014-01-10 MED ORDER — ZOLPIDEM TARTRATE 5 MG PO TABS
5.0000 mg | ORAL_TABLET | Freq: Every evening | ORAL | Status: DC | PRN
Start: 2014-01-10 — End: 2014-01-13

## 2014-01-10 MED ORDER — FENTANYL CITRATE 0.05 MG/ML IJ SOLN
INTRAMUSCULAR | Status: DC | PRN
  Administered 2014-01-10: 85 ug via INTRAVENOUS

## 2014-01-10 MED ORDER — SIMETHICONE 80 MG PO CHEW
80.0000 mg | CHEWABLE_TABLET | ORAL | Status: DC
  Administered 2014-01-11 – 2014-01-13 (×2): 80 mg via ORAL
  Filled 2014-01-10 (×2): qty 1

## 2014-01-10 MED ORDER — FENTANYL CITRATE 0.05 MG/ML IJ SOLN
INTRAMUSCULAR | Status: AC
  Administered 2014-01-10: 50 ug via INTRAVENOUS
  Filled 2014-01-10: qty 2

## 2014-01-10 MED ORDER — LACTATED RINGERS IV SOLN
40.0000 [IU] | INTRAVENOUS | Status: DC | PRN
  Administered 2014-01-10: 40 [IU] via INTRAVENOUS

## 2014-01-10 MED ORDER — FENTANYL CITRATE 0.05 MG/ML IJ SOLN
INTRAMUSCULAR | Status: DC | PRN
  Administered 2014-01-10: 15 ug via INTRATHECAL

## 2014-01-10 SURGICAL SUPPLY — 36 items
BENZOIN TINCTURE PRP APPL 2/3 (GAUZE/BANDAGES/DRESSINGS) ×2 IMPLANT
CLAMP CORD UMBIL (MISCELLANEOUS) IMPLANT
CLOTH BEACON ORANGE TIMEOUT ST (SAFETY) ×2 IMPLANT
DRAPE LG THREE QUARTER DISP (DRAPES) IMPLANT
DRSG OPSITE POSTOP 4X10 (GAUZE/BANDAGES/DRESSINGS) ×2 IMPLANT
DRSG OPSITE POSTOP 4X12 (GAUZE/BANDAGES/DRESSINGS) ×2 IMPLANT
DURAPREP 26ML APPLICATOR (WOUND CARE) ×2 IMPLANT
ELECT REM PT RETURN 9FT ADLT (ELECTROSURGICAL) ×2
ELECTRODE REM PT RTRN 9FT ADLT (ELECTROSURGICAL) ×1 IMPLANT
EXTRACTOR VACUUM BELL STYLE (SUCTIONS) IMPLANT
GLOVE BIO SURGEON STRL SZ 6.5 (GLOVE) ×2 IMPLANT
GLOVE BIOGEL PI IND STRL 7.0 (GLOVE) ×1 IMPLANT
GLOVE BIOGEL PI INDICATOR 7.0 (GLOVE) ×1
GOWN STRL REUS W/TWL LRG LVL3 (GOWN DISPOSABLE) ×6 IMPLANT
KIT ABG SYR 3ML LUER SLIP (SYRINGE) IMPLANT
NEEDLE HYPO 25X5/8 SAFETYGLIDE (NEEDLE) IMPLANT
NS IRRIG 1000ML POUR BTL (IV SOLUTION) ×2 IMPLANT
PACK C SECTION WH (CUSTOM PROCEDURE TRAY) ×2 IMPLANT
PAD OB MATERNITY 4.3X12.25 (PERSONAL CARE ITEMS) ×2 IMPLANT
RETRACTOR WND ALEXIS 25 LRG (MISCELLANEOUS) ×1 IMPLANT
RTRCTR WOUND ALEXIS 25CM LRG (MISCELLANEOUS) ×2
STAPLER VISISTAT 35W (STAPLE) IMPLANT
STRIP CLOSURE SKIN 1/2X4 (GAUZE/BANDAGES/DRESSINGS) ×2 IMPLANT
SUT CHROMIC 2 0 CT 1 (SUTURE) ×4 IMPLANT
SUT MNCRL 0 VIOLET CTX 36 (SUTURE) ×2 IMPLANT
SUT MONOCRYL 0 CTX 36 (SUTURE) ×2
SUT PDS AB 0 CTX 36 PDP370T (SUTURE) ×2 IMPLANT
SUT PLAIN 2 0 (SUTURE)
SUT PLAIN 2 0 XLH (SUTURE) ×4 IMPLANT
SUT PLAIN ABS 2-0 CT1 27XMFL (SUTURE) IMPLANT
SUT VIC AB 0 CT1 27 (SUTURE)
SUT VIC AB 0 CT1 27XBRD ANBCTR (SUTURE) IMPLANT
SUT VIC AB 4-0 KS 27 (SUTURE) ×2 IMPLANT
TOWEL OR 17X24 6PK STRL BLUE (TOWEL DISPOSABLE) ×2 IMPLANT
TRAY FOLEY CATH 14FR (SET/KITS/TRAYS/PACK) ×2 IMPLANT
WATER STERILE IRR 1000ML POUR (IV SOLUTION) ×2 IMPLANT

## 2014-01-10 NOTE — Transfer of Care (Signed)
Immediate Anesthesia Transfer of Care Note  Patient: Vicki DenmarkJennifer Ortiz  Procedure(s) Performed: Procedure(s): CESAREAN SECTION (N/A)  Patient Location: PACU  Anesthesia Type:Spinal  Level of Consciousness: awake  Airway & Oxygen Therapy: Patient Spontanous Breathing  Post-op Assessment: Report given to PACU RN and Post -op Vital signs reviewed and stable  Post vital signs: stable  Complications: No apparent anesthesia complications

## 2014-01-10 NOTE — Anesthesia Preprocedure Evaluation (Signed)
Anesthesia Evaluation  Patient identified by MRN, date of birth, ID band Patient awake    Reviewed: Allergy & Precautions, H&P , NPO status , Patient's Chart, lab work & pertinent test results, reviewed documented beta blocker date and time   History of Anesthesia Complications (+) PONV, POST - OP SPINAL HEADACHE and history of anesthetic complications  Airway Mallampati: II TM Distance: >3 FB Neck ROM: full    Dental  (+) Teeth Intact   Pulmonary neg pulmonary ROS,  breath sounds clear to auscultation        Cardiovascular negative cardio ROS  Rhythm:regular Rate:Normal     Neuro/Psych negative neurological ROS  negative psych ROS   GI/Hepatic negative GI ROS, Neg liver ROS,   Endo/Other  diabetesObese - BMI 38  Renal/GU negative Renal ROS  Female GU complaint     Musculoskeletal   Abdominal   Peds  Hematology negative hematology ROS (+) anemia ,   Anesthesia Other Findings   Reproductive/Obstetrics (+) Pregnancy (h/o c/s x2, labor for repeat C/S)                           Anesthesia Physical Anesthesia Plan  ASA: II and emergent  Anesthesia Plan: Spinal   Post-op Pain Management:    Induction:   Airway Management Planned:   Additional Equipment:   Intra-op Plan:   Post-operative Plan:   Informed Consent: I have reviewed the patients History and Physical, chart, labs and discussed the procedure including the risks, benefits and alternatives for the proposed anesthesia with the patient or authorized representative who has indicated his/her understanding and acceptance.     Plan Discussed with: Surgeon and CRNA  Anesthesia Plan Comments:         Anesthesia Quick Evaluation

## 2014-01-10 NOTE — Anesthesia Procedure Notes (Signed)
Spinal  Patient location during procedure: OR Start time: 01/10/2014 6:23 PM Staffing Performed by: anesthesiologist  Preanesthetic Checklist Completed: patient identified, site marked, surgical consent, pre-op evaluation, timeout performed, IV checked, risks and benefits discussed and monitors and equipment checked Spinal Block Patient position: sitting Prep: site prepped and draped and DuraPrep Patient monitoring: heart rate, cardiac monitor, continuous pulse ox and blood pressure Approach: midline Location: L3-4 Injection technique: single-shot Needle Needle type: Pencan  Needle gauge: 24 G Needle length: 9 cm Assessment Sensory level: T4 Additional Notes Epidural placed easily on first attempt.  No paresthesia.  Patient tolerated procedure well with no apparent complications.  Jasmine DecemberA. Cassidy, MD

## 2014-01-10 NOTE — Progress Notes (Signed)
Dr. Maple HudsonMoser (anestisiologist) notified of pain 7/10 upon patient arrival to MB unit. Dr. Maple HudsonMoser gave OK to start given percocet now.

## 2014-01-10 NOTE — H&P (Signed)
Vicki Ortiz is a 28 y.o. female presenting for admission for elective repeat Cesarean delivery at term. Pt. C/o contractions  Since yesterday. In office 3-4cm   Maternal Medical History:  Reason for admission: Contractions.   Contractions: Onset was 3-5 hours ago.   Frequency: irregular.   Duration is approximately 1 minute.   Perceived severity is strong.    Fetal activity: Perceived fetal activity is normal.   Last perceived fetal movement was within the past 12 hours.    Prenatal Complications - Diabetes: none.    OB History   Grav Para Term Preterm Abortions TAB SAB Ect Mult Living   4 3 3       3      Past Medical History  Diagnosis Date  . Spinal headache   . PONV (postoperative nausea and vomiting)   . Anemia   . Gestational diabetes    Past Surgical History  Procedure Laterality Date  . Cesarean section    . Cervical cone biopsy    . Laproscopy for endometriois     Family History: family history includes COPD in her father; Diabetes in her father and mother. Social History:  reports that she has never smoked. She has never used smokeless tobacco. She reports that she does not drink alcohol or use illicit drugs.   Prenatal Transfer Tool  Maternal Diabetes: No Genetic Screening: Normal Maternal Ultrasounds/Referrals: Normal Fetal Ultrasounds or other Referrals:  None Maternal Substance Abuse:  No Significant Maternal Medications:  None Significant Maternal Lab Results:  None Other Comments:  None  Review of Systems  Constitutional: Negative for fever.  Eyes: Negative for blurred vision.  Respiratory: Negative for cough.   Cardiovascular: Negative for chest pain.  Gastrointestinal: Negative for heartburn.  Genitourinary: Negative for dysuria.  Musculoskeletal: Negative for myalgias.  Skin: Negative for rash.  Neurological: Negative for dizziness and headaches.      Height 5\' 10"  (1.778 m), weight 120.203 kg (265 lb). Maternal Exam:  Uterine  Assessment: Contraction strength is moderate.  Contraction duration is 1 minute. Contraction frequency is irregular.   Abdomen: Patient reports no abdominal tenderness. Surgical scars: low transverse.   Fundal height is 39 cm.   Estimated fetal weight is 3800 grams.   Fetal presentation: vertex  Introitus: Normal vulva. Normal vagina.  Ferning test: not done.  Nitrazine test: not done.  Pelvis: questionable for delivery.   Cervix: Cervix evaluated by digital exam.   3-4cm / 50%/-2  Physical Exam  Vitals reviewed. Constitutional: She is oriented to person, place, and time. She appears well-developed and well-nourished.  HENT:  Head: Normocephalic.  Eyes: Pupils are equal, round, and reactive to light.  Cardiovascular: Normal rate and regular rhythm.   Respiratory: Effort normal.  Genitourinary: Vagina normal.  Musculoskeletal: Normal range of motion.  Neurological: She is alert and oriented to person, place, and time. She has normal reflexes.    Prenatal labs: ABO, Rh: --/--/A POS (05/18 1731) Antibody: PENDING (05/18 1731) Rubella:  Immune RPR:   NR HBsAg:   Neg HIV:   Neg GBS:   Neg  Assessment/Plan: Patient in early labor prior 2 cesarean deliveries To OR for elective repeat cesarean section   Vicki Ortiz 01/10/2014, 5:50 PM

## 2014-01-10 NOTE — Anesthesia Postprocedure Evaluation (Signed)
  Anesthesia Post-op Note  Patient: Vicki Ortiz  Procedure(s) Performed: Procedure(s): CESAREAN SECTION (N/A)  Patient Location: PACU  Anesthesia Type:Spinal  Level of Consciousness: awake and alert   Airway and Oxygen Therapy: Patient Spontanous Breathing  Post-op Pain: mild  Post-op Assessment: Post-op Vital signs reviewed, Patient's Cardiovascular Status Stable, Respiratory Function Stable, Patent Airway, No signs of Nausea or vomiting and Pain level controlled  Post-op Vital Signs: Reviewed and stable  Last Vitals:  Filed Vitals:   01/10/14 2100  BP: 108/63  Pulse: 92  Temp:   Resp: 16    Complications: No apparent anesthesia complications

## 2014-01-10 NOTE — MAU Note (Signed)
Patient states she is for a repeat cesarean section. Was 4 cm in the office today. [facility]. Patient will go to 172 to get ready for surgery.

## 2014-01-10 NOTE — Brief Op Note (Signed)
01/10/2014  7:52 PM  PATIENT:  Vicki Ortiz  28 y.o. female  PRE-OPERATIVE DIAGNOSIS:  repeat cesarean section  POST-OPERATIVE DIAGNOSIS:  repeat cesarean section  PROCEDURE:  Procedure(s): CESAREAN SECTION (N/A)  SURGEON:  Surgeon(s) and Role:    * Essie HartWalda Shloma Roggenkamp, MD - Primary  PHYSICIAN ASSISTANT:   ASSISTANTS: none   ANESTHESIA:   spinal  EBL:  Total I/O In: 1300 [I.V.:1300] Out: 950 [Urine:100; Blood:850]  BLOOD ADMINISTERED:none  DRAINS: none   LOCAL MEDICATIONS USED:  NONE  SPECIMEN:  No Specimen  DISPOSITION OF SPECIMEN:  N/A  COUNTS:  YES  TOURNIQUET:  * No tourniquets in log *  DICTATION: .Note written in EPIC  PLAN OF CARE: Admit to inpatient   PATIENT DISPOSITION:  PACU - hemodynamically stable.   Delay start of Pharmacological VTE agent (>24hrs) due to surgical blood loss or risk of bleeding: n/a

## 2014-01-10 NOTE — Op Note (Signed)
Cesarean Section Procedure Note  Indications: previous uterine incision kerr x2 and patient in early labor  Pre-operative Diagnosis: 38 week 2 day pregnancy, prior LTCS in early labor  Post-operative Diagnosis: same  Surgeon: Caffie Damme   Assistants: None  Anesthesia: Spinal anesthesia  ASA Class: 2  Procedure Details   The patient was seen in the Holding Room. The risks, benefits, complications, treatment options, and expected outcomes were discussed with the patient.  The patient concurred with the proposed plan, giving informed consent.  The site of surgery properly noted/marked. The patient was taken to Operating Room # 9, identified as Miriam Liles and the procedure verified as C-Section Delivery. A Time Out was held and the above information confirmed.  After induction of spinal anesthesia, the patient was placed in the dorsal supine position with a leftward tilt, draped and prepped in the usual sterile manner. A Pfannenstiel incision was made and carried down through the subcutaneous tissue to the fascia.  The fascia was incised in the midline and the fascial incision was extended laterally with Mayo scissors. The superior aspect of the fascial incision was grasped with Coker clamps x2, tented up and the rectus muscles dissected off sharply with the scalpel. The rectus was then dissected off with blunt dissection and Mayo scissors inferiorly. The rectus muscles were separated in the midline. The abdominal peritoneum was identified, tented up, entered sharply, and the incision was extended superiorly and inferiorly with good visualization of the bladder. The Alexis retractor was then deployed. The vesicouterine peritoneum was identified, tented up, entered sharply with Metzenbaum scissors, and the bladder flap was created digitally. Scalpel was then used to make a low transverse incision on the uterus which was extended laterally with  blunt dissection. The fetal vertex was  identified, difficulty delivering head, therefore Vacuum applied to fetal vertex and then the head was delivered easily through the uterine incision followed by the body. The A live female infant was bulb suctioned on the operative field cried vigorously, cord was clamped and cut and the infant was passed to the waiting neonatologist. Apgars 8/9. Placenta was then delivered spontaneously, intact and appear normal, the uterus was cleared of all clot and debris. The uterine incision was repaired with #0 Monocryl in running locked fashion. A second imbricating suture of 0 Monocryl was utilized to ensure hemostasis. Ovaries and tubes were inspected and normal. The peritoneal cavity was irrigated with warm normal saline. The Alexis retractor was removed.  The abdominal peritoneum was reapproximated with 2-0 Chromic in a running fashion, the rectus muscles was reapproximated one interrupted suture of 2-0 chromic.  in interrupted fashion. The fascia was closed with 0 looped PDS in a running fashion from both ends of fascia, Met in the middle. The subcutaneous layer was reapproximated with 2-0 plain gut. The skin was closed with 4-0 vicryl in a subcuticular fashion and dressed with benzoine, steri strips and pressure dressing. All sponge lap and needle counts were correct x2. Patient tolerated the procedure well and recovered in stable condition following the procedure.  Instrument, sponge, and needle counts were correct prior the abdominal closure and at the conclusion of the case.   Findings: Live female infant, Apgars 8/9, clear fluid, normal appearing placenta, normal uterus, bilateral tubes and ovaries  Estimated Blood Loss:  877m         Drains: Foley catheter                 Specimens: Placenta  Implants: none         Complications:  None; patient tolerated the procedure well.         Disposition: PACU - hemodynamically stable.  Rogelio Seen Neta Upadhyay

## 2014-01-11 ENCOUNTER — Encounter (HOSPITAL_COMMUNITY): Payer: Self-pay | Admitting: Obstetrics & Gynecology

## 2014-01-11 LAB — CBC
HEMATOCRIT: 28.5 % — AB (ref 36.0–46.0)
Hemoglobin: 9 g/dL — ABNORMAL LOW (ref 12.0–15.0)
MCH: 24.7 pg — AB (ref 26.0–34.0)
MCHC: 31.2 g/dL (ref 30.0–36.0)
MCV: 79.2 fL (ref 78.0–100.0)
Platelets: 162 10*3/uL (ref 150–400)
RBC: 3.6 MIL/uL — ABNORMAL LOW (ref 3.87–5.11)
RDW: 14.7 % (ref 11.5–15.5)
WBC: 10.6 10*3/uL — ABNORMAL HIGH (ref 4.0–10.5)

## 2014-01-11 LAB — RPR

## 2014-01-11 MED ORDER — MEASLES, MUMPS & RUBELLA VAC ~~LOC~~ INJ
0.5000 mL | INJECTION | Freq: Once | SUBCUTANEOUS | Status: DC
  Filled 2014-01-11: qty 0.5

## 2014-01-11 NOTE — Progress Notes (Addendum)
  Patient is eating, ambulating, voiding.  Pain control is good.  Filed Vitals:   01/11/14 0245 01/11/14 0250 01/11/14 0430 01/11/14 0617  BP: 101/68 134/63 99/67 92/59   Pulse: 99 103 84 78  Temp:   98 F (36.7 C) 97.3 F (36.3 C)  TempSrc:   Oral Oral  Resp:   18 18  Height:      Weight:      SpO2:   92% 92%    lungs:   clear to auscultation cor:    RRR Abdomen:  soft, appropriate tenderness, incisions intact and without erythema or exudate ex:    no cords   Lab Results  Component Value Date   WBC 10.6* 01/11/2014   HGB 9.0* 01/11/2014   HCT 28.5* 01/11/2014   MCV 79.2 01/11/2014   PLT 162 01/11/2014    --/--/A POS, A POS (05/18 1731)/R -unknown.  A/P    Post operative day 1.  Routine post op and postpartum care.  Expect d/c routine.  Percocet for pain control.           Uncertain from prenatals if Pt is Rubella Immune or not- will add to current blood work and give vaccine if non-immmune.

## 2014-01-11 NOTE — Anesthesia Postprocedure Evaluation (Signed)
  Anesthesia Post-op Note  Patient: Vicki DenmarkJennifer Hosking  Procedure(s) Performed: * No procedures listed *  Patient Location: Mother/Baby  Anesthesia Type:Spinal  Level of Consciousness: awake, alert  and oriented  Airway and Oxygen Therapy: Patient Spontanous Breathing  Post-op Pain: none  Post-op Assessment: Post-op Vital signs reviewed, Patient's Cardiovascular Status Stable, Respiratory Function Stable, No headache, No backache, No residual numbness and No residual motor weakness  Post-op Vital Signs: Reviewed and stable  Last Vitals:  Filed Vitals:   01/11/14 0617  BP: 92/59  Pulse: 78  Temp: 36.3 C  Resp: 18    Complications: No apparent anesthesia complications

## 2014-01-11 NOTE — Lactation Note (Signed)
This note was copied from the chart of Vicki Ortiz. Lactation Consultation Note Initial consult:  Mother states she has no milk.  Demonstrated hand expression and mother has a glistening drop. Mother states she just formula fed her baby but now she is spitty.  She wants to breastfeed also. Assisted mother to place baby in fb hold.  Baby latched but would not sustain latch.   Reviewed breast massage to keep her stimulated and encouraged her to continue to do STS. Mom made aware of O/P services, breastfeeding support groups, community resources, and our phone # for post-discharge questions.   Patient Name: Vicki Butler DenmarkJennifer Amir ZOXWR'UToday's Date: 01/11/2014 Reason for consult: Initial assessment   Maternal Data    Feeding    LATCH Score/Interventions                      Lactation Tools Discussed/Used     Consult Status Consult Status: Follow-up Date: 01/12/14 Follow-up type: In-patient    Dulce SellarRuth Boschen Berkelhammer 01/11/2014, 11:44 AM

## 2014-01-12 NOTE — Progress Notes (Signed)
POD#2 Pt doing well. Has typical C/S pain. Does not want discharge today. VSSAF IMP/ stable Plan/ routine care.

## 2014-01-12 NOTE — Lactation Note (Signed)
This note was copied from the chart of Girl Butler DenmarkJennifer Schreckengost. Lactation Consultation Note  Patient Name: Girl Butler DenmarkJennifer Oh ZOXWR'UToday's Date: 01/12/2014 Reason for consult: Follow-up assessment  Infant has breastfed x4 (10-20 min) + 2 attempts (5 minutes)+ bottled fed formula x4 (5-45 ml) in past 24 hrs.  Voids -2 in 24 hrs/ 3 life; stools -5 in 24 hrs and life.  LS-7 by RN.  Mom stated she plans to breast and bottle feed when she gets home.  Stated the baby is still hungry after breastfeeding so she gives the baby a bottle of formula.  Day of Life supplementation guidelines given for formula supplementing after breastfeeding.  Educated mom on supply/ demand and establishing a good milk supply.  Encouraged mom to breastfeed first prior to offering a bottle.  Encouraged mom to call for assistance and latch check with next feeding because mom c/o slight nipple tenderness.    Consult Status Consult Status: Follow-up Date: 01/13/14 Follow-up type: In-patient    Claiborne RiggStephanie Walker Rainer Mounce 01/12/2014, 3:44 PM

## 2014-01-13 MED ORDER — OXYCODONE-ACETAMINOPHEN 5-325 MG PO TABS: 2.0000 | ORAL_TABLET | ORAL | Status: AC | PRN

## 2014-01-13 MED ORDER — SENNA-DOCUSATE SODIUM 8.6-50 MG PO TABS: 1.0000 | ORAL_TABLET | Freq: Every day | ORAL | Status: AC

## 2014-01-13 MED ORDER — IBUPROFEN 600 MG PO TABS: 600.0000 mg | ORAL_TABLET | Freq: Four times a day (QID) | ORAL | Status: AC | PRN

## 2014-01-13 NOTE — Progress Notes (Signed)
Prenatal records report that patient is rubella non-immune 05/2013.

## 2014-01-13 NOTE — Lactation Note (Signed)
This note was copied from the chart of Vicki Ortiz. Lactation Consultation Note Follow up consult:  Mother's breasts are engorged.  Mother is getting ready to take a warm shower.  Provided mother with ice packs for her breasts after shower. Reviewed massaging breasts during breastfeeding until soft.  Provided hand pump and engorgement care information sheet. Mom encouraged to feed baby 8-12 times/24 hours and with feeding cues.  Encouraged parents to call with any further problems or questions.  Patient Name: Vicki [name] ZOXWR'UToday's Date: 01/13/2014 Reason for consult: Follow-up assessment   Maternal Data    Feeding Feeding Type: Breast Fed Length of feed: 5 min  LATCH Score/Interventions Latch: Grasps breast easily, tongue down, lips flanged, rhythmical sucking.  Audible Swallowing: A few with stimulation  Type of Nipple: Everted at rest and after stimulation  Comfort (Breast/Nipple): Soft / non-tender     Hold (Positioning): No assistance needed to correctly position infant at breast.  LATCH Score: 9  Lactation Tools Discussed/Used     Consult Status Consult Status: Complete    Dulce SellarRuth Boschen Colon Rueth 01/13/2014, 10:40 AM

## 2014-01-13 NOTE — Progress Notes (Signed)
Rubella antibody IgM ordered 5/19 by Dr. Henderson CloudHorvath.  Called lab to check on result.  Test was sent to outside lab with turn around time of 3-5 days.  Result not currently available.

## 2014-01-13 NOTE — Discharge Summary (Signed)
Obstetric Discharge Summary Reason for Admission: onset of labor Prenatal Procedures: NST and ultrasound Intrapartum Procedures: cesarean: low cervical, transverse Postpartum Procedures: none Complications-Operative and Postpartum: none Hemoglobin  Date Value Ref Range Status  01/11/2014 9.0* 12.0 - 15.0 g/dL Final     HCT  Date Value Ref Range Status  01/11/2014 28.5* 36.0 - 46.0 % Final    Physical Exam:  General: alert, cooperative and appears stated age 89Lochia: appropriate Uterine Fundus: firm Incision: healing well DVT Evaluation: No evidence of DVT seen on physical exam.  Discharge Diagnoses: Term Pregnancy-delivered  Discharge Information: Date: 01/13/2014 Activity: pelvic rest Diet: routine Medications: Ibuprofen, Percocet and senna-docusate Condition: improved Instructions: refer to practice specific booklet Discharge to: home Follow-up Information   Follow up with PINN, Sanjuana MaeWALDA [name], MD In 2 weeks. (For an incision check)    Specialty:  Obstetrics and Gynecology   Contact information:   [address] [place] [id number] [phone]       Newborn Data: Live born female  Birth Weight: 8 lb 4.6 oz (3759 g) APGAR: 8, 9  Home with mother.  Vicki Ortiz 01/13/2014, 9:24 AM

## 2014-01-14 LAB — RUBELLA ANTIBODY, IGM: Rubella IgM: 0.27 (ref <0.90)

## 2014-01-27 ENCOUNTER — Inpatient Hospital Stay (HOSPITAL_COMMUNITY)
Admission: AD | Admit: 2014-01-27 | Discharge: 2014-01-27 | Disposition: A | Discharge status: Home / Self Care | Payer: Medicaid Other | Source: Ambulatory Visit | Attending: Obstetrics and Gynecology | Admitting: Obstetrics and Gynecology

## 2014-01-27 ENCOUNTER — Encounter (HOSPITAL_COMMUNITY): Payer: Self-pay | Admitting: *Deleted

## 2014-01-27 ENCOUNTER — Inpatient Hospital Stay (HOSPITAL_COMMUNITY): Payer: Medicaid Other

## 2014-01-27 DIAGNOSIS — O99893 Other specified diseases and conditions complicating puerperium: Secondary | ICD-10-CM | POA: Insufficient documentation

## 2014-01-27 DIAGNOSIS — O9989 Other specified diseases and conditions complicating pregnancy, childbirth and the puerperium: Principal | ICD-10-CM

## 2014-01-27 DIAGNOSIS — R109 Unspecified abdominal pain: Secondary | ICD-10-CM | POA: Insufficient documentation

## 2014-01-27 LAB — URINALYSIS, ROUTINE W REFLEX MICROSCOPIC
BILIRUBIN URINE: NEGATIVE
GLUCOSE, UA: NEGATIVE mg/dL
KETONES UR: NEGATIVE mg/dL
LEUKOCYTES UA: NEGATIVE
Nitrite: NEGATIVE
Protein, ur: NEGATIVE mg/dL
Specific Gravity, Urine: 1.025 (ref 1.005–1.030)
Urobilinogen, UA: 0.2 mg/dL (ref 0.0–1.0)
pH: 5 (ref 5.0–8.0)

## 2014-01-27 LAB — CBC WITH DIFFERENTIAL/PLATELET
Basophils Absolute: 0 10*3/uL (ref 0.0–0.1)
Basophils Relative: 0 % (ref 0–1)
Eosinophils Absolute: 0.2 10*3/uL (ref 0.0–0.7)
Eosinophils Relative: 2 % (ref 0–5)
HCT: 39.1 % (ref 36.0–46.0)
Hemoglobin: 12 g/dL (ref 12.0–15.0)
LYMPHS ABS: 2.2 10*3/uL (ref 0.7–4.0)
LYMPHS PCT: 28 % (ref 12–46)
MCH: 24.5 pg — ABNORMAL LOW (ref 26.0–34.0)
MCHC: 30.7 g/dL (ref 30.0–36.0)
MCV: 79.8 fL (ref 78.0–100.0)
MONOS PCT: 6 % (ref 3–12)
Monocytes Absolute: 0.5 10*3/uL (ref 0.1–1.0)
NEUTROS ABS: 5 10*3/uL (ref 1.7–7.7)
NEUTROS PCT: 64 % (ref 43–77)
Platelets: 337 10*3/uL (ref 150–400)
RBC: 4.9 MIL/uL (ref 3.87–5.11)
RDW: 14.7 % (ref 11.5–15.5)
WBC: 7.9 10*3/uL (ref 4.0–10.5)

## 2014-01-27 LAB — COMPREHENSIVE METABOLIC PANEL
ALT: 22 U/L (ref 0–35)
AST: 29 U/L (ref 0–37)
Albumin: 3.9 g/dL (ref 3.5–5.2)
Alkaline Phosphatase: 73 U/L (ref 39–117)
BILIRUBIN TOTAL: 0.3 mg/dL (ref 0.3–1.2)
BUN: 10 mg/dL (ref 6–23)
CO2: 24 mEq/L (ref 19–32)
Calcium: 9.7 mg/dL (ref 8.4–10.5)
Chloride: 104 mEq/L (ref 96–112)
Creatinine, Ser: 0.8 mg/dL (ref 0.50–1.10)
GFR calc Af Amer: >90 mL/min (ref >90)
GFR calc non Af Amer: >90 mL/min (ref >90)
GLUCOSE: 88 mg/dL (ref 70–99)
POTASSIUM: 4.3 meq/L (ref 3.7–5.3)
Sodium: 140 mEq/L (ref 137–147)
Total Protein: 7.5 g/dL (ref 6.0–8.3)

## 2014-01-27 LAB — URINE MICROSCOPIC-ADD ON

## 2014-01-27 MED ORDER — ONDANSETRON 8 MG PO TBDP
8.0000 mg | ORAL_TABLET | Freq: Once | ORAL | Status: AC
  Administered 2014-01-27: 8 mg via ORAL
  Filled 2014-01-27: qty 1

## 2014-01-27 MED ORDER — KETOROLAC TROMETHAMINE 60 MG/2ML IM SOLN
60.0000 mg | Freq: Once | INTRAMUSCULAR | Status: AC
  Administered 2014-01-27: 60 mg via INTRAMUSCULAR
  Filled 2014-01-27: qty 2

## 2014-01-27 MED ORDER — IOHEXOL 300 MG/ML  SOLN
50.0000 mL | INTRAMUSCULAR | Status: AC
  Administered 2014-01-27: 50 mL via ORAL

## 2014-01-27 MED ORDER — ONDANSETRON 8 MG PO TBDP: 8.0000 mg | ORAL_TABLET | Freq: Once | ORAL | Status: DC

## 2014-01-27 MED ORDER — IBUPROFEN 800 MG PO TABS: 800.0000 mg | ORAL_TABLET | Freq: Three times a day (TID) | ORAL | Status: AC

## 2014-01-27 MED ORDER — ONDANSETRON 8 MG PO TBDP: 8.0000 mg | ORAL_TABLET | Freq: Three times a day (TID) | ORAL | Status: AC | PRN

## 2014-01-27 MED ORDER — OXYCODONE-ACETAMINOPHEN 5-325 MG PO TABS: 2.0000 | ORAL_TABLET | ORAL | Status: AC | PRN

## 2014-01-27 MED ORDER — IOHEXOL 300 MG/ML  SOLN
100.0000 mL | Freq: Once | INTRAMUSCULAR | Status: AC | PRN
  Administered 2014-01-27: 100 mL via INTRAVENOUS

## 2014-01-27 NOTE — MAU Note (Signed)
Patient states she had a cesarean section on 5-18. States she started having left abdominal pain this am. Was seen in the office and sent to MAU for evaluation.

## 2014-01-27 NOTE — MAU Provider Note (Signed)
History     CSN: [id number]  Arrival date and time: 01/27/14 1706   First Provider Initiated Contact with Patient 01/27/14 1921      Chief Complaint  Patient presents with  . Abdominal Pain   HPI  Patient states she had a cesarean section on 5-18. States she started having left abdominal pain this am. Was seen in the office and sent to MAU for  For evlautaion. Orders were put in by physician  Past Medical History  Diagnosis Date  . Spinal headache   . Anemia   . PONV (postoperative nausea and vomiting)   . Gestational diabetes     2nd pregnancy    Past Surgical History  Procedure Laterality Date  . Cesarean section    . Cervical cone biopsy    . Laproscopy for endometriois    . Cesarean section N/A 01/10/2014    Procedure: CESAREAN SECTION;  Surgeon: Essie Hart, MD;  Location: [facility];  Service: Obstetrics;  Laterality: N/A;    Family History  Problem Relation Age of Onset  . Diabetes Mother   . COPD Father   . Diabetes Father     History  Substance Use Topics  . Smoking status: Never Smoker   . Smokeless tobacco: Never Used  . Alcohol Use: No    Allergies:  Allergies  Allergen Reactions  . Tramadol Nausea And Vomiting and Other (See Comments)    Weakness: Dizziness    Prescriptions prior to admission  Medication Sig Dispense Refill  . ibuprofen (ADVIL,MOTRIN) 600 MG tablet Take 1 tablet (600 mg total) by mouth every 6 (six) hours as needed.  90 tablet  0  . oxyCODONE-acetaminophen (ROXICET) 5-325 MG per tablet Take 2 tablets by mouth every 4 (four) hours as needed for severe pain. May take 1-2 tablets every 4-6 hours as needed for pain  46 tablet  0  . sennosides-docusate sodium (SENOKOT-S) 8.6-50 MG tablet Take 1 tablet by mouth daily.  30 tablet  1    Review of Systems  Constitutional: Negative for fever and chills.  Gastrointestinal: Positive for abdominal pain. Negative for nausea, vomiting, diarrhea and constipation.  Genitourinary: Negative for  dysuria.   Physical Exam   Blood pressure 109/72, pulse 85, temperature 98.4 F (36.9 C), temperature source Oral, resp. rate 20, SpO2 98.00%, currently breastfeeding.  Physical Exam  Nursing note and vitals reviewed. Constitutional: She is oriented to person, place, and time. She appears well-developed and well-nourished. No distress.  HENT:  Head: Normocephalic.  Eyes: Pupils are equal, round, and reactive to light.  Neck: Normal range of motion. Neck supple.  Cardiovascular: Normal rate.   Respiratory: Effort normal.  GI: Soft. There is tenderness.  Musculoskeletal: Normal range of motion.  Neurological: She is alert and oriented to person, place, and time.  Skin: Skin is warm and dry.  Psychiatric: She has a normal mood and affect.    MAU Course  Procedures Results for orders placed during the hospital encounter of 01/27/14 (from the past 24 hour(s))  CBC WITH DIFFERENTIAL     Status: Abnormal   Collection Time    01/27/14  6:22 PM      Result Value Ref Range   WBC 7.9  4.0 - 10.5 K/uL   RBC 4.90  3.87 - 5.11 MIL/uL   Hemoglobin 12.0  12.0 - 15.0 g/dL   HCT 02.5  42.7 - 06.2 %   MCV 79.8  78.0 - 100.0 fL   MCH 24.5 (*) 26.0 -  34.0 pg   MCHC 30.7  30.0 - 36.0 g/dL   RDW 40.914.7  81.111.5 - 91.415.5 %   Platelets 337  150 - 400 K/uL   Neutrophils Relative % 64  43 - 77 %   Neutro Abs 5.0  1.7 - 7.7 K/uL   Lymphocytes Relative 28  12 - 46 %   Lymphs Abs 2.2  0.7 - 4.0 K/uL   Monocytes Relative 6  3 - 12 %   Monocytes Absolute 0.5  0.1 - 1.0 K/uL   Eosinophils Relative 2  0 - 5 %   Eosinophils Absolute 0.2  0.0 - 0.7 K/uL   Basophils Relative 0  0 - 1 %   Basophils Absolute 0.0  0.0 - 0.1 K/uL  COMPREHENSIVE METABOLIC PANEL     Status: None   Collection Time    01/27/14  6:22 PM      Result Value Ref Range   Sodium 140  137 - 147 mEq/L   Potassium 4.3  3.7 - 5.3 mEq/L   Chloride 104  96 - 112 mEq/L   CO2 24  19 - 32 mEq/L   Glucose, Bld 88  70 - 99 mg/dL   BUN 10  6 -  23 mg/dL   Creatinine, Ser 7.820.80  0.50 - 1.10 mg/dL   Calcium 9.7  8.4 - 95.610.5 mg/dL   Total Protein 7.5  6.0 - 8.3 g/dL   Albumin 3.9  3.5 - 5.2 g/dL   AST 29  0 - 37 U/L   ALT 22  0 - 35 U/L   Alkaline Phosphatase 73  39 - 117 U/L   Total Bilirubin 0.3  0.3 - 1.2 mg/dL   GFR calc non Af Amer >90  >90 mL/min   GFR calc Af Amer >90  >90 mL/min  URINALYSIS, ROUTINE W REFLEX MICROSCOPIC     Status: Abnormal   Collection Time    01/27/14  6:55 PM      Result Value Ref Range   Color, Urine YELLOW  YELLOW   APPearance CLEAR  CLEAR   Specific Gravity, Urine 1.025  1.005 - 1.030   pH 5.0  5.0 - 8.0   Glucose, UA NEGATIVE  NEGATIVE mg/dL   Hgb urine dipstick TRACE (*) NEGATIVE   Bilirubin Urine NEGATIVE  NEGATIVE   Ketones, ur NEGATIVE  NEGATIVE mg/dL   Protein, ur NEGATIVE  NEGATIVE mg/dL   Urobilinogen, UA 0.2  0.0 - 1.0 mg/dL   Nitrite NEGATIVE  NEGATIVE   Leukocytes, UA NEGATIVE  NEGATIVE  URINE MICROSCOPIC-ADD ON     Status: None   Collection Time    01/27/14  6:55 PM      Result Value Ref Range   Squamous Epithelial / LPF RARE  RARE   WBC, UA 0-2  <3 WBC/hpf   RBC / HPF 0-2  <3 RBC/hpf   Bacteria, UA RARE  RARE   Urine-Other MUCOUS PRESENT    Ct Abdomen Pelvis W Contrast  01/27/2014   CLINICAL DATA:  Status post C section on 01/10/2014. Low abdominal pain.  EXAM: CT ABDOMEN AND PELVIS WITH CONTRAST  TECHNIQUE: Multidetector CT imaging of the abdomen and pelvis was performed using the standard protocol following bolus administration of intravenous contrast.  CONTRAST:  100mL OMNIPAQUE IOHEXOL 300 MG/ML  SOLN  COMPARISON:  None.  FINDINGS: Mild enlargement of the postpartum uterus. There is stranding in the periuterine fat and there is stranding along the subcutaneous fat of the low  abdomen consistent with a low transverse incision. There is no fluid collection to suggest a hematoma or abscess. There is no free fluid in the pelvis. Ovaries and adnexa are unremarkable. The bladder is  mostly decompressed but also unremarkable.  Clear lung bases the heart is normal in size.  Liver, spleen, gallbladder, pancreas, adrenal glands: Normal.  Small low-density lesion along the anterior upper pole of the right kidney, likely a cyst. Kidneys otherwise unremarkable. Normal ureters.  No pathologically enlarged lymph nodes.  No ascites.  Colon and small bowel are unremarkable.  Normal appendix.  No bony abnormality.  IMPRESSION: 1. No acute findings. No evidence of a complication from the recent prior C-section. There are the expected post- operative, postpartum changes. 2. Probable small right renal cyst. 3. No other abnormalities.  Normal appendix is visualized.   Electronically Signed   By: Amie Portland M.D.   On: 01/27/2014 21:25  discussed with Dr. Candise Che give Toradol Im while here and send home with Percocet (pt needs zofran also since she has N/V with percocet)   Assessment and Plan  abd pain D/C home- Rx for Percocet and Zofran and ibuprofen 800mg   Jean Rosenthal 01/27/2014, 7:22 PM

## 2014-01-28 NOTE — MAU Provider Note (Signed)
Reviewed case with NP and I agree with above note  Essie Hart

## 2014-06-19 IMAGING — CT CT ABD-PELV W/ CM
1 of 4 series · 15 of 32 positions shown, 19 images · IV contrast (OMNIPAQUE)
Comparison: None.

CLINICAL DATA: Status post C section on 01/10/2014. Low abdominal
pain.

EXAM:
CT ABDOMEN AND PELVIS WITH CONTRAST
TECHNIQUE: Multidetector CT imaging of the abdomen and pelvis was performed
using the standard protocol following bolus administration of
intravenous contrast.
CONTRAST:  100mL OMNIPAQUE IOHEXOL 300 MG/ML  SOLN

[Series 3: (person_name) thins · axial · 0.73mm/px · z∈[-478,-27]mm · 15 of 703 slices shown, 19 images]
[im 29/703  soft-tissue]
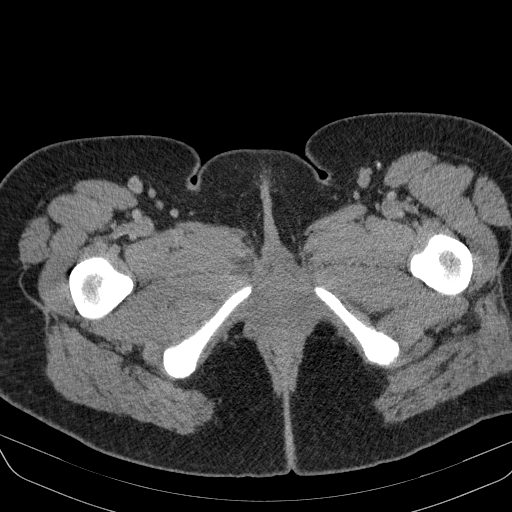
[im 29/703  bone]
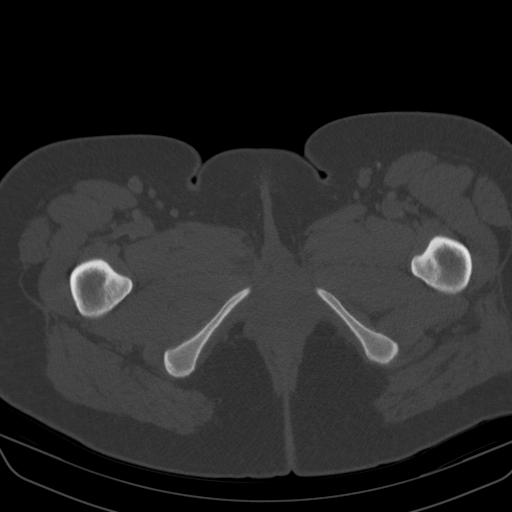
[im 85/703  soft-tissue]
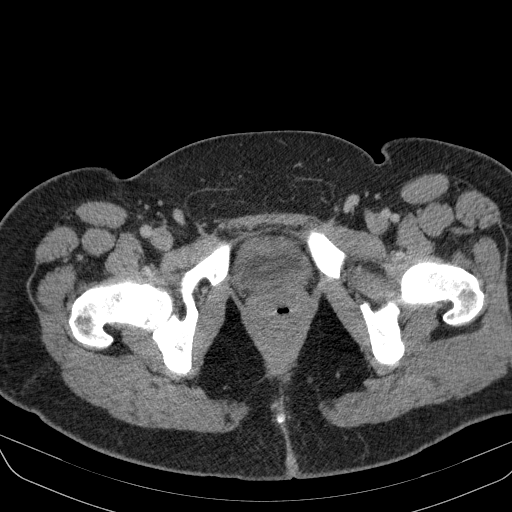
[im 141/703  soft-tissue]
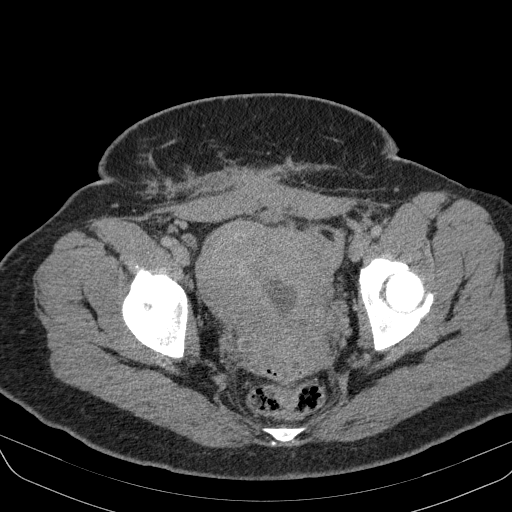
[im 197/703  soft-tissue]
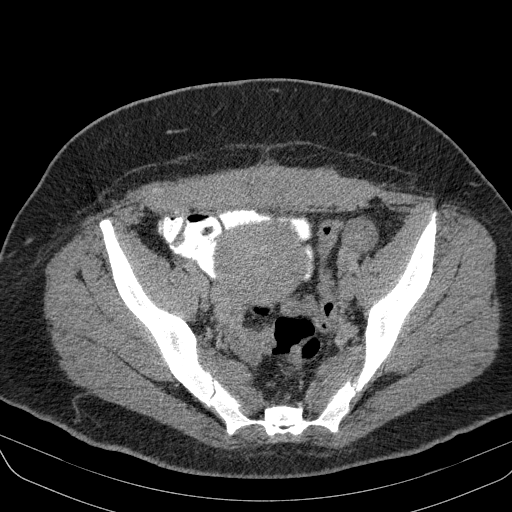
[im 253/703  soft-tissue]
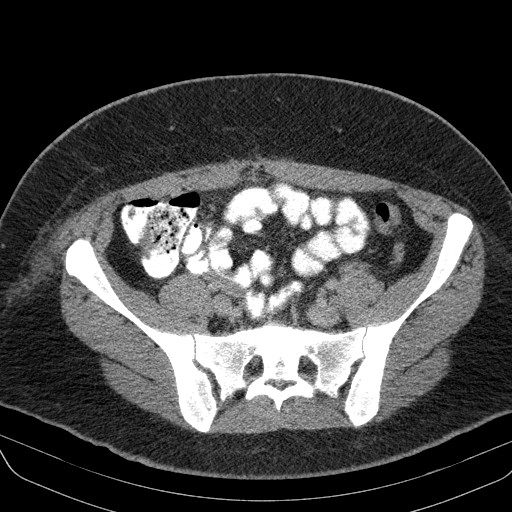
[im 309/703  soft-tissue]
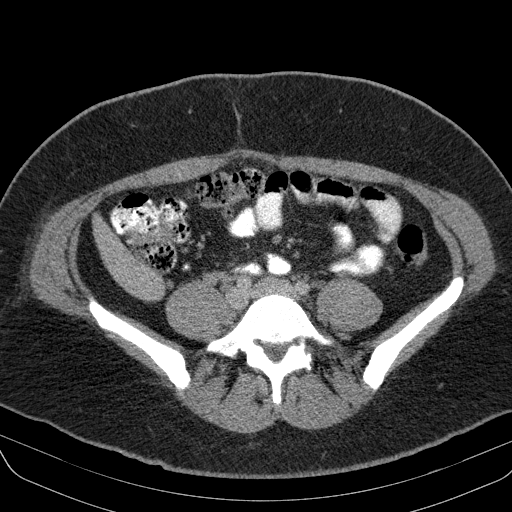
[im 366/703  soft-tissue]
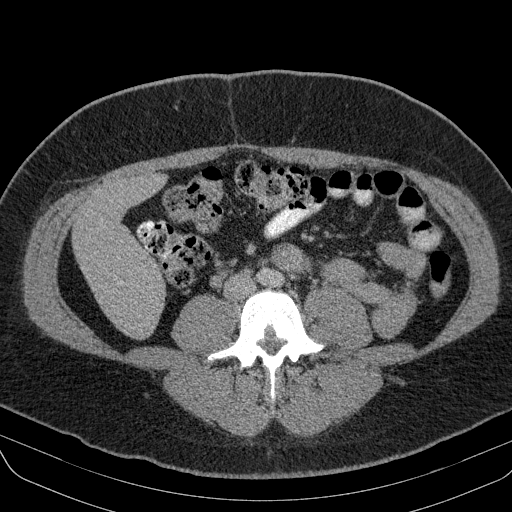
[im 394/703  soft-tissue]
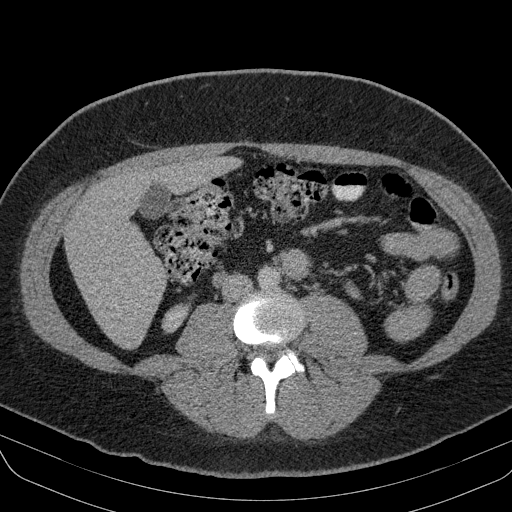
[im 450/703  soft-tissue]
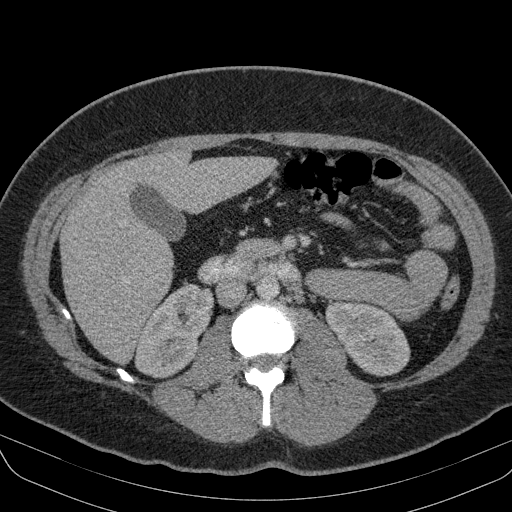
[im 450/703  bone]
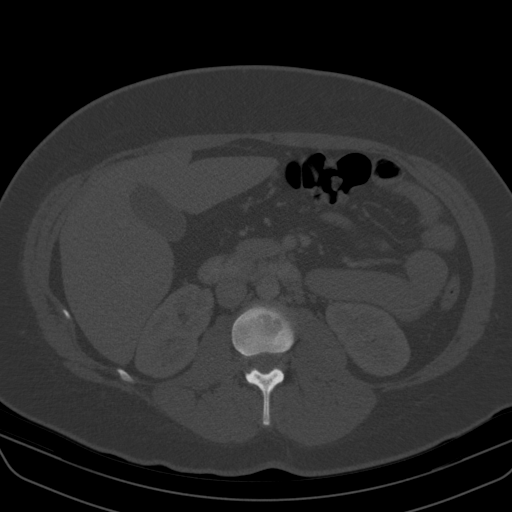
[im 506/703  soft-tissue]
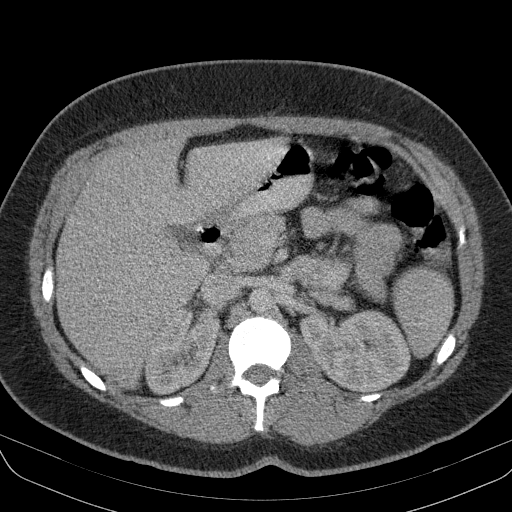
[im 562/703  soft-tissue]
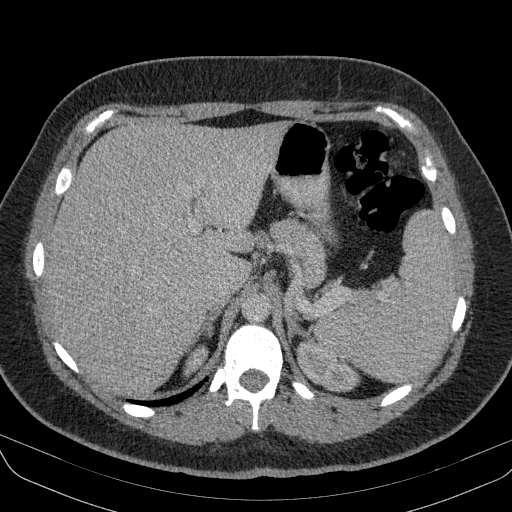
[im 590/703  lung]
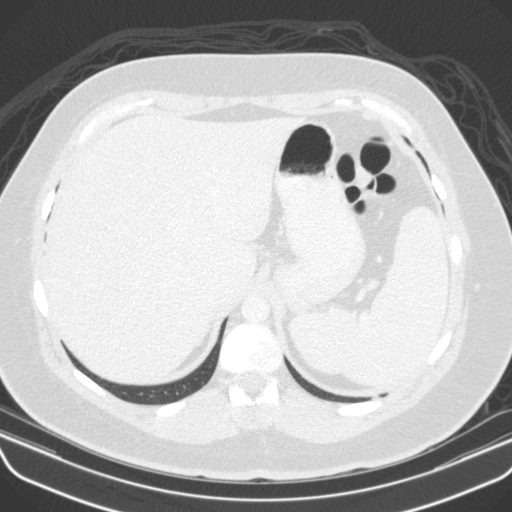
[im 618/703  soft-tissue]
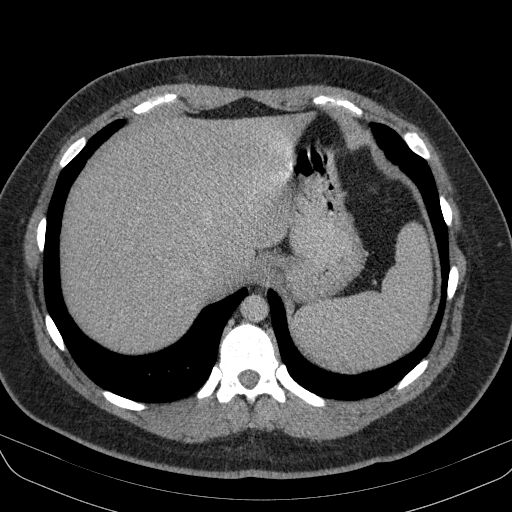
[im 618/703  lung]
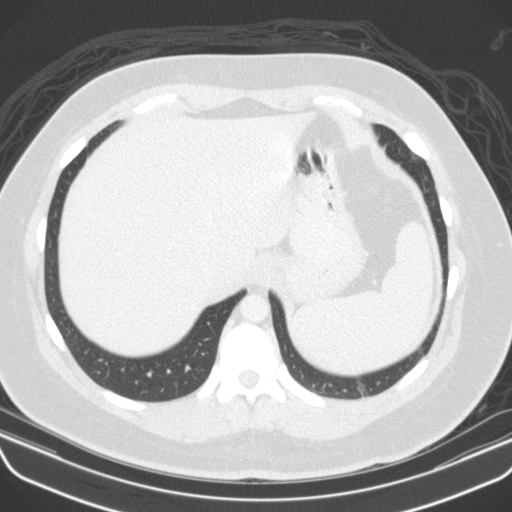
[im 646/703  lung]
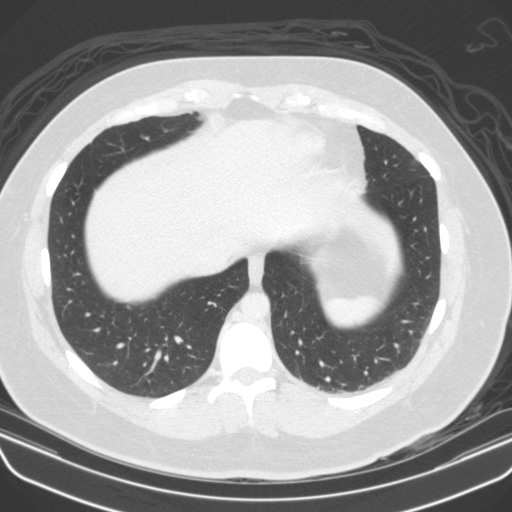
[im 674/703  soft-tissue]
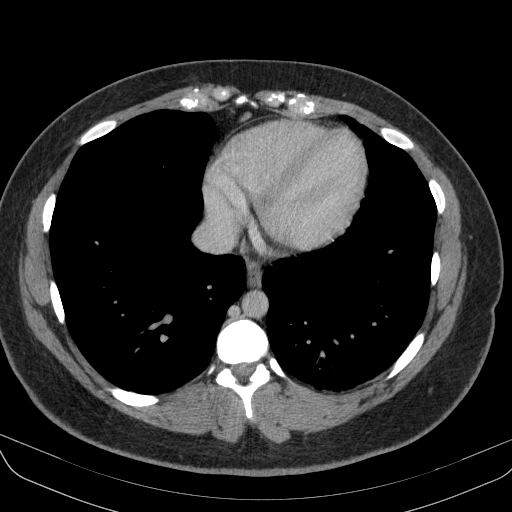
[im 674/703  lung]
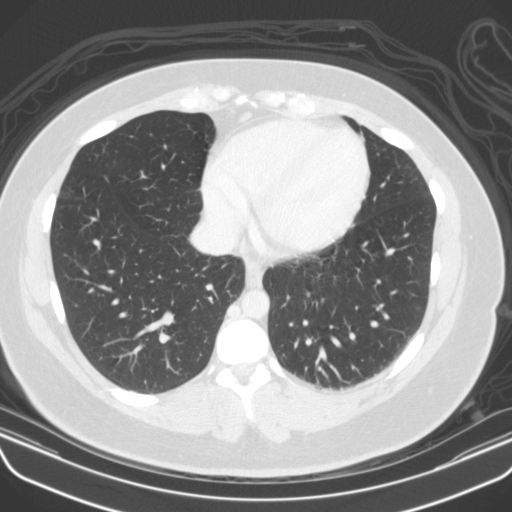

[15 of 32 positions shown; findings below may reference images not displayed]

FINDINGS: Mild enlargement of the postpartum uterus. There is stranding in the
periuterine fat and there is stranding along the subcutaneous fat of
the low abdomen consistent with a low transverse incision. There is
no fluid collection to suggest a hematoma or abscess. There is no
free fluid in the pelvis. Ovaries and adnexa are unremarkable. The
bladder is mostly decompressed but also unremarkable.

Clear lung bases the heart is normal in size.

Liver, spleen, gallbladder, pancreas, adrenal glands: Normal.

Small low-density lesion along the anterior upper pole of the right
kidney, likely a cyst. Kidneys otherwise unremarkable. Normal
ureters.

No pathologically enlarged lymph nodes.  No ascites.

Colon and small bowel are unremarkable.  Normal appendix.

No bony abnormality.
IMPRESSION: 1. No acute findings. No evidence of a complication from the recent
prior C-section. There are the expected post- operative, postpartum
changes.
2. Probable small right renal cyst.
3. No other abnormalities.  Normal appendix is visualized.

## 2014-06-27 ENCOUNTER — Encounter (HOSPITAL_COMMUNITY): Payer: Self-pay | Admitting: *Deleted
